# Patient Record
Sex: Male | Born: 1955 | Race: White | Hispanic: No | Marital: Married | Smoking: Former smoker
Health system: Southern US, Community
[De-identification: ages and names within clinical notes are randomized; demographics above are authoritative.]

## PROBLEM LIST (undated history)

## (undated) DIAGNOSIS — R569 Unspecified convulsions: Secondary | ICD-10-CM

## (undated) DIAGNOSIS — K219 Gastro-esophageal reflux disease without esophagitis: Secondary | ICD-10-CM

## (undated) DIAGNOSIS — K635 Polyp of colon: Secondary | ICD-10-CM

## (undated) DIAGNOSIS — Z720 Tobacco use: Secondary | ICD-10-CM

## (undated) DIAGNOSIS — I10 Essential (primary) hypertension: Secondary | ICD-10-CM

## (undated) DIAGNOSIS — F411 Generalized anxiety disorder: Secondary | ICD-10-CM

## (undated) HISTORY — DX: Generalized anxiety disorder: F41.1

## (undated) HISTORY — DX: Tobacco use: Z72.0

## (undated) HISTORY — DX: Essential (primary) hypertension: I10

## (undated) HISTORY — DX: Polyp of colon: K63.5

## (undated) HISTORY — DX: Gastro-esophageal reflux disease without esophagitis: K21.9

## (undated) HISTORY — DX: Unspecified convulsions: R56.9

---

## 1998-11-27 ENCOUNTER — Ambulatory Visit (HOSPITAL_COMMUNITY): Admission: RE | Admit: 1998-11-27 | Discharge: 1998-11-27 | Payer: Self-pay | Admitting: Gastroenterology

## 1999-04-04 ENCOUNTER — Other Ambulatory Visit: Admission: RE | Admit: 1999-04-04 | Discharge: 1999-04-04 | Payer: Self-pay | Admitting: Urology

## 2006-01-12 ENCOUNTER — Ambulatory Visit: Payer: Self-pay | Admitting: Family Medicine

## 2006-05-19 HISTORY — PX: COLONOSCOPY W/ POLYPECTOMY: SHX1380

## 2007-12-21 ENCOUNTER — Ambulatory Visit: Payer: Self-pay | Admitting: Family Medicine

## 2008-09-06 ENCOUNTER — Ambulatory Visit: Payer: Self-pay | Admitting: Family Medicine

## 2009-05-31 ENCOUNTER — Ambulatory Visit: Payer: Self-pay | Admitting: Family Medicine

## 2011-01-30 ENCOUNTER — Ambulatory Visit (INDEPENDENT_AMBULATORY_CARE_PROVIDER_SITE_OTHER): Payer: 59 | Admitting: Family Medicine

## 2011-01-30 ENCOUNTER — Encounter: Payer: Self-pay | Admitting: Family Medicine

## 2011-01-30 VITALS — BP 130/98 | HR 72 | Ht 76.0 in | Wt 231.0 lb

## 2011-01-30 DIAGNOSIS — N529 Male erectile dysfunction, unspecified: Secondary | ICD-10-CM

## 2011-01-30 DIAGNOSIS — I10 Essential (primary) hypertension: Secondary | ICD-10-CM

## 2011-01-30 DIAGNOSIS — Z23 Encounter for immunization: Secondary | ICD-10-CM

## 2011-01-30 DIAGNOSIS — Z79899 Other long term (current) drug therapy: Secondary | ICD-10-CM

## 2011-01-30 LAB — CBC WITH DIFFERENTIAL/PLATELET
Eosinophils Relative: 2 % (ref 0–5)
HCT: 43.5 % (ref 39.0–52.0)
Lymphocytes Relative: 42 % (ref 12–46)
Lymphs Abs: 1.8 10*3/uL (ref 0.7–4.0)
MCH: 33.1 pg (ref 26.0–34.0)
MCV: 94.2 fL (ref 78.0–100.0)
Monocytes Absolute: 0.4 10*3/uL (ref 0.1–1.0)
Monocytes Relative: 9 % (ref 3–12)
RBC: 4.62 MIL/uL (ref 4.22–5.81)
WBC: 4.3 10*3/uL (ref 4.0–10.5)

## 2011-01-30 LAB — COMPREHENSIVE METABOLIC PANEL
ALT: 26 U/L (ref 0–53)
BUN: 16 mg/dL (ref 6–23)
CO2: 27 mEq/L (ref 19–32)
Calcium: 9.4 mg/dL (ref 8.4–10.5)
Chloride: 104 mEq/L (ref 96–112)
Creat: 0.84 mg/dL (ref 0.50–1.35)
Glucose, Bld: 96 mg/dL (ref 70–99)

## 2011-01-30 LAB — LIPID PANEL
Cholesterol: 185 mg/dL (ref 0–200)
HDL: 76 mg/dL (ref >39)
Total CHOL/HDL Ratio: 2.4 Ratio
Triglycerides: 43 mg/dL (ref <150)

## 2011-01-30 MED ORDER — LISINOPRIL-HYDROCHLOROTHIAZIDE 10-12.5 MG PO TABS: 1.0000 | ORAL_TABLET | Freq: Every day | ORAL | Status: DC

## 2011-01-30 MED ORDER — VARDENAFIL HCL 20 MG PO TABS: 20.0000 mg | ORAL_TABLET | Freq: Every day | ORAL | Status: DC | PRN

## 2011-01-30 NOTE — Progress Notes (Signed)
  Subjective:    Patient ID: Nathaniel Pena, male    DOB: [birth year], 55 y.o.   MRN: [id number]  HPI He is here for a blood pressure recheck. He did stop taking his medications stating he didn't think he needed them anymore. He recently had blood pressure checked and it was elevated. He would also like a refill on his Levitra. This medication seems to be working well when he has the opportunity. He has not had blood work done in the last year.   Review of Systems     Objective:   Physical Exam Alert and in no distress. Blood pressure is recorded.       Assessment & Plan:   1. Hypertension    2. Encounter for long-term (current) use of other medications  CBC w/Diff, Comprehensive metabolic panel, Lipid panel  3. ED (erectile dysfunction)     I will renew his blood pressure medication as well as Levitra. Routine blood screening. Encouraged him to stay on the medication and explained the reason behind treating his hypertension. Recheck here in one month.

## 2011-02-03 ENCOUNTER — Telehealth: Payer: Self-pay

## 2011-02-03 NOTE — Telephone Encounter (Signed)
Called pt to inform labs look good

## 2011-02-12 ENCOUNTER — Other Ambulatory Visit: Payer: Self-pay | Admitting: Family Medicine

## 2011-03-03 ENCOUNTER — Encounter: Payer: Self-pay | Admitting: Family Medicine

## 2011-03-04 ENCOUNTER — Encounter: Payer: Self-pay | Admitting: Family Medicine

## 2011-03-04 ENCOUNTER — Ambulatory Visit (INDEPENDENT_AMBULATORY_CARE_PROVIDER_SITE_OTHER): Payer: 59 | Admitting: Family Medicine

## 2011-03-04 VITALS — BP 150/120 | HR 76 | Wt 217.0 lb

## 2011-03-04 DIAGNOSIS — I10 Essential (primary) hypertension: Secondary | ICD-10-CM

## 2011-03-04 NOTE — Progress Notes (Signed)
  Subjective:    Patient ID: Nathaniel Pena, male    DOB: 07/27/1955, 55 y.o.   MRN: [id number]  HPI He is here for a recheck on his blood pressure. He did miss taking his meds the last 2 days.   Review of Systems     Objective:   Physical Exam Alert and in no distress. 150/110       Assessment & Plan:  Hypertension. I will order a renal artery ultrasound

## 2011-03-04 NOTE — Patient Instructions (Signed)
I will call you when I get the results back

## 2012-02-06 ENCOUNTER — Other Ambulatory Visit: Payer: Self-pay | Admitting: Family Medicine

## 2012-02-10 ENCOUNTER — Telehealth: Payer: Self-pay | Admitting: Family Medicine

## 2012-02-10 MED ORDER — LISINOPRIL-HYDROCHLOROTHIAZIDE 10-12.5 MG PO TABS: 1.0000 | ORAL_TABLET | Freq: Every day | ORAL | Status: DC

## 2012-02-10 NOTE — Telephone Encounter (Signed)
I SENT RX FOR 30 DAYS TO HIS PHARMACY. CLS

## 2012-03-01 ENCOUNTER — Telehealth: Payer: Self-pay | Admitting: Family Medicine

## 2012-03-01 MED ORDER — LISINOPRIL-HYDROCHLOROTHIAZIDE 10-12.5 MG PO TABS: 1.0000 | ORAL_TABLET | Freq: Every day | ORAL | Status: DC

## 2012-03-01 NOTE — Telephone Encounter (Signed)
Pt needs refill on Lisinopril has cpe 03/26/12.  Refilled 1 month [facility] [address]

## 2012-03-09 ENCOUNTER — Encounter: Payer: 59 | Admitting: Family Medicine

## 2012-03-22 ENCOUNTER — Encounter: Payer: Self-pay | Admitting: Internal Medicine

## 2012-03-24 ENCOUNTER — Encounter: Payer: 59 | Admitting: Family Medicine

## 2012-03-26 ENCOUNTER — Encounter: Payer: Self-pay | Admitting: Family Medicine

## 2012-03-26 ENCOUNTER — Ambulatory Visit (INDEPENDENT_AMBULATORY_CARE_PROVIDER_SITE_OTHER): Payer: 59 | Admitting: Family Medicine

## 2012-03-26 VITALS — BP 150/110 | HR 68 | Ht 76.0 in | Wt 215.0 lb

## 2012-03-26 DIAGNOSIS — N529 Male erectile dysfunction, unspecified: Secondary | ICD-10-CM | POA: Insufficient documentation

## 2012-03-26 DIAGNOSIS — Z23 Encounter for immunization: Secondary | ICD-10-CM

## 2012-03-26 DIAGNOSIS — Z789 Other specified health status: Secondary | ICD-10-CM | POA: Insufficient documentation

## 2012-03-26 DIAGNOSIS — I10 Essential (primary) hypertension: Secondary | ICD-10-CM

## 2012-03-26 DIAGNOSIS — Z0000 Encounter for general adult medical examination without abnormal findings: Secondary | ICD-10-CM

## 2012-03-26 DIAGNOSIS — F1010 Alcohol abuse, uncomplicated: Secondary | ICD-10-CM

## 2012-03-26 LAB — CBC WITH DIFFERENTIAL/PLATELET
Basophils Absolute: 0 10*3/uL (ref 0.0–0.1)
Basophils Relative: 1 % (ref 0–1)
Eosinophils Relative: 1 % (ref 0–5)
HCT: 43.6 % (ref 39.0–52.0)
Hemoglobin: 15.6 g/dL (ref 13.0–17.0)
MCH: 33.8 pg (ref 26.0–34.0)
MCHC: 35.8 g/dL (ref 30.0–36.0)
MCV: 94.6 fL (ref 78.0–100.0)
Monocytes Absolute: 0.6 10*3/uL (ref 0.1–1.0)
Monocytes Relative: 10 % (ref 3–12)
RDW: 12.9 % (ref 11.5–15.5)

## 2012-03-26 LAB — POCT URINALYSIS DIPSTICK
Bilirubin, UA: NEGATIVE
Blood, UA: NEGATIVE
Glucose, UA: NEGATIVE
Nitrite, UA: NEGATIVE
Urobilinogen, UA: NEGATIVE

## 2012-03-26 MED ORDER — LISINOPRIL-HYDROCHLOROTHIAZIDE 20-12.5 MG PO TABS: 1.0000 | ORAL_TABLET | Freq: Every day | ORAL | Status: DC

## 2012-03-26 MED ORDER — VARDENAFIL HCL 20 MG PO TABS: 20.0000 mg | ORAL_TABLET | Freq: Every day | ORAL | Status: DC | PRN

## 2012-03-26 NOTE — Progress Notes (Signed)
  Subjective:    Patient ID: Nathaniel Pena, male    DOB: 1955-09-16, 56 y.o.   MRN: [id number]  HPI He is here for complete examination. There is a question of whether he is getting HCTZ with his lisinopril. He has been checking his pressures at home over the last several months and have been elevated. He intermittently uses Levitra to help with erectile dysfunction. He continues to drink roughly a sixpack per night. 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 His home life is stable. He has a colonoscopy set up in the near future.  Review of Systems  Constitutional: Negative.   HENT: Negative.   Eyes: Negative.   Respiratory: Negative.   Gastrointestinal: Negative.   Genitourinary: Negative.   Musculoskeletal: Negative.   Skin: Negative.   Neurological: Negative.   Hematological: Negative.   Psychiatric/Behavioral: Negative.        Objective:   Physical Exam BP 150/110  Pulse 68  Ht 6\' 4"  (1.93 m)  Wt 215 lb (97.523 kg)  BMI 26.17 kg/m2  General Appearance:    Alert, cooperative, no distress, appears stated age  Head:    Normocephalic, without obvious abnormality, atraumatic  Eyes:    PERRL, conjunctiva/corneas clear, EOM's intact, fundi    benign  Ears:    Normal TM's and external ear canals  Nose:   Nares normal, mucosa normal, no drainage or sinus   tenderness  Throat:   Lips, mucosa, and tongue normal; teeth and gums normal  Neck:   Supple, no lymphadenopathy;  thyroid:  no   enlargement/tenderness/nodules; no carotid   bruit or JVD  Back:    Spine nontender, no curvature, ROM normal, no CVA     tenderness  Lungs:     Clear to auscultation bilaterally without wheezes, rales or     ronchi; respirations unlabored  Chest Wall:    No tenderness or deformity   Heart:    Regular rate and rhythm, S1 and S2 normal, no murmur, rub   or gallop  Breast Exam:    No chest wall tenderness, masses or gynecomastia  Abdomen:     Soft, non-tender, nondistended, normoactive  bowel sounds,    no masses, no hepatosplenomegaly  Genitalia:   deferred at patient request   Rectal:   deferred   Extremities:   No clubbing, cyanosis or edema  Pulses:   2+ and symmetric all extremities  Skin:   Skin color, texture, turgor normal, no rashes or lesions  Lymph nodes:   Cervical, supraclavicular, and axillary nodes normal  Neurologic:   CNII-XII intact, normal strength, sensation and gait; reflexes 2+ and symmetric throughout          Psych:   Normal mood, affect, hygiene and grooming.           Assessment & Plan:   1. Routine general medical examination at a health care facility  Lipid panel, CBC with Differential, Comprehensive metabolic panel, POCT Urinalysis Dipstick  2. Hypertension  lisinopril-hydrochlorothiazide (ZESTORETIC) 20-12.5 MG per tablet, Lipid panel, CBC with Differential, Comprehensive metabolic panel, POCT Urinalysis Dipstick  3. ED (erectile dysfunction)  vardenafil (LEVITRA) 20 MG tablet  4. Heavy alcohol use  Comprehensive metabolic panel   discussed the need for him to cut back on alcohol consumption to one or 2 beverages per day however it is very unlikely that this will occur.

## 2012-03-27 LAB — LIPID PANEL
Cholesterol: 179 mg/dL (ref 0–200)
HDL: 91 mg/dL (ref >39)
Triglycerides: 31 mg/dL (ref <150)

## 2012-03-27 LAB — COMPREHENSIVE METABOLIC PANEL
AST: 32 U/L (ref 0–37)
Albumin: 4.5 g/dL (ref 3.5–5.2)
Alkaline Phosphatase: 49 U/L (ref 39–117)
BUN: 11 mg/dL (ref 6–23)
Calcium: 9.2 mg/dL (ref 8.4–10.5)
Creat: 0.75 mg/dL (ref 0.50–1.35)
Glucose, Bld: 64 mg/dL — ABNORMAL LOW (ref 70–99)
Potassium: 4.1 mEq/L (ref 3.5–5.3)

## 2012-03-28 NOTE — Progress Notes (Signed)
Quick Note:  The blood work is normal ______ 

## 2012-04-26 ENCOUNTER — Encounter: Payer: Self-pay | Admitting: Family Medicine

## 2012-04-26 ENCOUNTER — Ambulatory Visit (INDEPENDENT_AMBULATORY_CARE_PROVIDER_SITE_OTHER): Payer: 59 | Admitting: Family Medicine

## 2012-04-26 VITALS — BP 138/90 | HR 64 | Wt 211.0 lb

## 2012-04-26 DIAGNOSIS — I10 Essential (primary) hypertension: Secondary | ICD-10-CM

## 2012-04-26 DIAGNOSIS — F1010 Alcohol abuse, uncomplicated: Secondary | ICD-10-CM

## 2012-04-26 DIAGNOSIS — Z789 Other specified health status: Secondary | ICD-10-CM

## 2012-04-26 NOTE — Progress Notes (Signed)
  Subjective:    Patient ID: Nathaniel Pena, male    DOB: 10-05-1955, 56 y.o.   MRN: [id number]  HPI He is here for a followup visit on his last encounter we discussed his alcohol use and he states he has cut down to approximately 4 beers per day. Also his lisinopril was increased. He has been checking this at home and notes that they are in a good range.   Review of Systems     Objective:   Physical Exam Alert and in no distress. Blood pressure is recorded.       Assessment & Plan:   1. Hypertension   2. Heavy alcohol use    continue present medication regimen. Again recommended cutting back on alcohol consumption to one or 2 per day.

## 2012-06-01 LAB — HM COLONOSCOPY

## 2012-06-02 ENCOUNTER — Other Ambulatory Visit: Payer: Self-pay | Admitting: Family Medicine

## 2012-07-16 ENCOUNTER — Encounter: Payer: Self-pay | Admitting: Family Medicine

## 2012-09-09 ENCOUNTER — Other Ambulatory Visit: Payer: Self-pay | Admitting: Family Medicine

## 2013-04-07 ENCOUNTER — Institutional Professional Consult (permissible substitution): Payer: Self-pay | Admitting: Family Medicine

## 2013-04-08 ENCOUNTER — Encounter: Payer: Self-pay | Admitting: Medical

## 2013-04-08 ENCOUNTER — Ambulatory Visit (INDEPENDENT_AMBULATORY_CARE_PROVIDER_SITE_OTHER): Payer: 59 | Admitting: Medical

## 2013-04-08 VITALS — BP 130/90 | HR 55 | Temp 97.6°F | Resp 16 | Wt 208.0 lb

## 2013-04-08 DIAGNOSIS — F1010 Alcohol abuse, uncomplicated: Secondary | ICD-10-CM

## 2013-04-08 DIAGNOSIS — F341 Dysthymic disorder: Secondary | ICD-10-CM

## 2013-04-08 DIAGNOSIS — F418 Other specified anxiety disorders: Secondary | ICD-10-CM

## 2013-04-08 MED ORDER — ALPRAZOLAM 0.5 MG PO TABS: ORAL_TABLET | ORAL | Status: DC

## 2013-04-08 MED ORDER — BUPROPION HCL ER (XL) 150 MG PO TB24: 150.0000 mg | ORAL_TABLET | Freq: Every day | ORAL | Status: DC

## 2013-04-08 NOTE — Progress Notes (Signed)
Subjective: Here today to discuss concerns.  He reports a long history of alcohol consumption, typically on the order of a sixpack per night. His primary care provider here Dr. Susann Givens has urged him to stop alcohol several times in the past. Lately he has been giving this more thought.  His wife has been on him to quit as well, and he has been waking up some thinking about his alcohol consumption. He decided to stop cold [place] this past Sunday 5 days ago. He is here today because after taking a few of his wife's Xanax, this completely took away his urge to drink and he helped with his anxiety.  He has been prescribed Xanax 5 years ago for anxiety. This helps calm his nerves. 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 His anxiety has worsened by watching news stories that are typically negative. Overall his home life is fine, has a good relationship with his wife has a 68 year old and 40 year old children at home. His work is fine, works at a golf course.  He has never seen psychiatry, counselor, therapist. He has never been treated for alcoholism or withdrawals.  Was briefly on Prozac a few years ago prescribed by [place] here, but this made him almost carefree about things.  He reports that his mother also dealt with anxiety and alcoholism.  Despite drinking a 6 pack nightly he considers himself functional. Has built up a tolerance.  Denies homicidal or suicidal ideation.  Objective: Filed Vitals:   04/08/13 0910  BP: 130/90  Pulse: 55  Temp: 97.6 F (36.4 C)  Resp: 16    General appearance: alert, no distress, WD/WN Psych: pleasant, good eye contact, answers questions appropriately  Assessment: Encounter Diagnoses  Name Primary?  . Depression with anxiety Yes  . Alcohol abuse     Plan: We discussed his concerns, his alcohol use, his past.  Discussed the possibility of alcohol withdrawal. Discussed  depression.  Discussed triggers for his anxiety.   I recommend he consider counseling which we discussed.  Discussed attending an AA meeting that he probably would not do.  Advise he consider calling the help line through his employer for counseling. Return or go to the emergency department if experiencing withdrawal symptoms. Advise he cut out watching news on TV, instead get his news by reading the paper or reading it online so he doesn't get all the auditory and visual stimuli.  He is agreeable to beginning Wellbutrin, gave short-term Xanax both for anxiety and to help with preventing withdrawals.   He understands and is agreeable to this plan. Follow-up 1-2 weeks with Dr. Susann Givens

## 2013-04-12 ENCOUNTER — Ambulatory Visit (INDEPENDENT_AMBULATORY_CARE_PROVIDER_SITE_OTHER): Payer: 59 | Admitting: Family Medicine

## 2013-04-12 ENCOUNTER — Encounter: Payer: Self-pay | Admitting: Family Medicine

## 2013-04-12 DIAGNOSIS — F411 Generalized anxiety disorder: Secondary | ICD-10-CM

## 2013-04-12 DIAGNOSIS — F1010 Alcohol abuse, uncomplicated: Secondary | ICD-10-CM

## 2013-04-12 DIAGNOSIS — F419 Anxiety disorder, unspecified: Secondary | ICD-10-CM

## 2013-04-12 NOTE — Progress Notes (Signed)
  Subjective:    Patient ID: Nathaniel Pena, male    DOB: 02-Aug-1955, 57 y.o.   MRN: [id number]  HPI He is here for consult concerning his medication and alcohol. He has subsequently stopped drinking at the suggestion of his wife. He notes that since he stopped drinking his sleeping habits have improved. He also recognizes that he was having more anxiety than he realized. He seems to worry about the future especially with his children. He did take Xanax and notes that the Xanax actually helps better than the alcohol in regard to his anxiety and he would like more of this.   Review of Systems     Objective:   Physical Exam Alert and in no distress with appropriate affect and dressed appropriately.       Assessment & Plan:  ETOH abuse  Anxiety  over 30 minutes spent discussing his anxiety and the use of alcohol. I discussed anxiety in regard to worrying about the future. Recommended that he use the serenity prayer to help with this. Recommend he use Xanax sparingly and only as needed for when he is about to slip over the edge. He did state that he was willing to start the Wellbutrin to see if this would help with his underlying anxiety. Discussed counseling with him however at this point is not willing. Recheck here in about 2 weeks.

## 2013-04-12 NOTE — Patient Instructions (Signed)
When you become anxious I want you to use the serenity prayer. I will give you Xanax for when your bouts slipped over the edge but I will not keep you on it. Xanax is to be used when you're about to go over the edge not when you think you might

## 2013-06-17 ENCOUNTER — Telehealth: Payer: Self-pay | Admitting: Family Medicine

## 2013-06-17 MED ORDER — LISINOPRIL-HYDROCHLOROTHIAZIDE 20-12.5 MG PO TABS: ORAL_TABLET | ORAL | Status: DC

## 2013-06-17 NOTE — Telephone Encounter (Signed)
Lisinopril calld in.

## 2013-09-05 ENCOUNTER — Ambulatory Visit (INDEPENDENT_AMBULATORY_CARE_PROVIDER_SITE_OTHER): Payer: 59 | Admitting: Family Medicine

## 2013-09-05 ENCOUNTER — Encounter: Payer: Self-pay | Admitting: Family Medicine

## 2013-09-05 VITALS — BP 150/100 | HR 72 | Wt 210.0 lb

## 2013-09-05 DIAGNOSIS — N529 Male erectile dysfunction, unspecified: Secondary | ICD-10-CM

## 2013-09-05 DIAGNOSIS — Z79899 Other long term (current) drug therapy: Secondary | ICD-10-CM

## 2013-09-05 DIAGNOSIS — F1090 Alcohol use, unspecified, uncomplicated: Secondary | ICD-10-CM

## 2013-09-05 DIAGNOSIS — Z789 Other specified health status: Secondary | ICD-10-CM

## 2013-09-05 DIAGNOSIS — F341 Dysthymic disorder: Secondary | ICD-10-CM

## 2013-09-05 DIAGNOSIS — I10 Essential (primary) hypertension: Secondary | ICD-10-CM

## 2013-09-05 LAB — CBC WITH DIFFERENTIAL/PLATELET
BASOS PCT: 0 % (ref 0–1)
Basophils Absolute: 0 10*3/uL (ref 0.0–0.1)
EOS PCT: 1 % (ref 0–5)
Eosinophils Absolute: 0 10*3/uL (ref 0.0–0.7)
HCT: 40.9 % (ref 39.0–52.0)
HEMOGLOBIN: 13.8 g/dL (ref 13.0–17.0)
LYMPHS ABS: 1.3 10*3/uL (ref 0.7–4.0)
Lymphocytes Relative: 40 % (ref 12–46)
MCH: 30.9 pg (ref 26.0–34.0)
MCHC: 33.7 g/dL (ref 30.0–36.0)
MCV: 91.5 fL (ref 78.0–100.0)
MONOS PCT: 9 % (ref 3–12)
Monocytes Absolute: 0.3 10*3/uL (ref 0.1–1.0)
Neutro Abs: 1.6 10*3/uL — ABNORMAL LOW (ref 1.7–7.7)
Neutrophils Relative %: 50 % (ref 43–77)
Platelets: 166 10*3/uL (ref 150–400)
RBC: 4.47 MIL/uL (ref 4.22–5.81)
RDW: 14.2 % (ref 11.5–15.5)
WBC: 3.2 10*3/uL — ABNORMAL LOW (ref 4.0–10.5)

## 2013-09-05 MED ORDER — LISINOPRIL-HYDROCHLOROTHIAZIDE 20-12.5 MG PO TABS: ORAL_TABLET | ORAL | Status: DC

## 2013-09-05 MED ORDER — VARDENAFIL HCL 20 MG PO TABS: 20.0000 mg | ORAL_TABLET | Freq: Every day | ORAL | Status: DC | PRN

## 2013-09-05 MED ORDER — BUPROPION HCL ER (XL) 150 MG PO TB24: 150.0000 mg | ORAL_TABLET | Freq: Every day | ORAL | Status: DC

## 2013-09-05 NOTE — Progress Notes (Signed)
   Subjective:    Patient ID: Nathaniel Pena, male    DOB: Oct 05, 1955, 58 y.o.   MRN: [id number]  HPI  Mr. Liem Copenhaver is a 58 yo man with PMH significant for HTN and ED who presents today for medication follow up. The patient recently ran out of his hypertension medication and believes that is why his BP was elevated today, he last took his medication 3 days ago. The patient normally checks his BP at home daily and the readings are generally around 120/80. The patient also needs refills of his levitra today. The patient lost his previous Wellbutrin prescription and so never filled it. He would like that medication represcribed today. He states that he continues to worry excessively. He states that he has cut back slightly on his alcohol consumption. His work and home life are stable. He would like his medications renewed. Review of Systems is negative except per HPI.     Objective:   Physical Exam  Constitutional: Patient is oriented to person, place, and time and well-developed, well-nourished, and in no distress. Cardiovascular: Normal rate, regular rhythm. Exam reveals no murmurs, gallops and no friction rub.  Pulmonary/Chest: Effort normal and breath sounds normal. No respiratory distress. No wheezes or ronchi.      Assessment & Plan:  ED (erectile dysfunction) - Plan: vardenafil (LEVITRA) 20 MG tablet  Heavy alcohol use  Hypertension - Plan: lisinopril-hydrochlorothiazide (PRINZIDE,ZESTORETIC) 20-12.5 MG per tablet, CBC with Differential, Comprehensive metabolic panel  Dysthymia - Plan: buPROPion (WELLBUTRIN XL) 150 MG 24 hr tablet  Encounter for long-term (current) use of other medications - Plan: CBC with Differential, Comprehensive metabolic panel, Lipid panel  Will refill his prescriptions today. Did get him to commit to having only 2 beers per night. He will followup with me in one month.

## 2013-09-05 NOTE — Progress Notes (Deleted)
   Subjective:    Patient ID: EVERETTE MALL, male    DOB: [birth year], 58 y.o.   MRN: [id number]  HPI  Mr. Porter Moes is a 58 yo man with PMH significant for HTN and ED who presents today for medication follow up. The patient recently ran out of his hypertension medication and believes that is why his BP was elevated today, he last took his medication 3 days ago. The patient normally checks his BP at home daily and the readings are generally around 120/80. The patient also needs refills of his levitra today. The patient lost his previous Wellbutrin prescription and so never filled it. He would like that medication represcribed today.   Review of Systems is negative except per HPI.     Objective:   Physical Exam  Constitutional: Patient is oriented to person, place, and time and well-developed, well-nourished, and in no distress. Cardiovascular: Normal rate, regular rhythm. Exam reveals no murmurs, gallops and no friction rub.  Pulmonary/Chest: Effort normal and breath sounds normal. No respiratory distress. No wheezes or ronchi.      Assessment & Plan:  ED (erectile dysfunction)  Heavy alcohol use  Hypertension  Will refill his prescriptions today.

## 2013-09-06 LAB — COMPREHENSIVE METABOLIC PANEL
ALK PHOS: 48 U/L (ref 39–117)
ALT: 36 U/L (ref 0–53)
AST: 34 U/L (ref 0–37)
Albumin: 3.9 g/dL (ref 3.5–5.2)
BILIRUBIN TOTAL: 0.3 mg/dL (ref 0.2–1.2)
BUN: 8 mg/dL (ref 6–23)
CO2: 26 meq/L (ref 19–32)
CREATININE: 0.69 mg/dL (ref 0.50–1.35)
Calcium: 8.4 mg/dL (ref 8.4–10.5)
Chloride: 110 mEq/L (ref 96–112)
Glucose, Bld: 91 mg/dL (ref 70–99)
Potassium: 4.4 mEq/L (ref 3.5–5.3)
Sodium: 144 mEq/L (ref 135–145)
Total Protein: 5.9 g/dL — ABNORMAL LOW (ref 6.0–8.3)

## 2013-09-06 LAB — LIPID PANEL
CHOLESTEROL: 168 mg/dL (ref 0–200)
HDL: 79 mg/dL (ref >39)
LDL Cholesterol: 82 mg/dL (ref 0–99)
TRIGLYCERIDES: 35 mg/dL (ref <150)
Total CHOL/HDL Ratio: 2.1 Ratio
VLDL: 7 mg/dL (ref 0–40)

## 2014-01-30 ENCOUNTER — Ambulatory Visit (INDEPENDENT_AMBULATORY_CARE_PROVIDER_SITE_OTHER): Payer: 59 | Admitting: Family Medicine

## 2014-01-30 ENCOUNTER — Encounter: Payer: Self-pay | Admitting: Family Medicine

## 2014-01-30 ENCOUNTER — Telehealth: Payer: Self-pay

## 2014-01-30 ENCOUNTER — Ambulatory Visit
Admission: RE | Admit: 2014-01-30 | Discharge: 2014-01-30 | Disposition: A | Discharge status: Home / Self Care | Payer: 59 | Source: Ambulatory Visit | Attending: Family Medicine | Admitting: Family Medicine

## 2014-01-30 VITALS — BP 150/100 | HR 100 | Wt 202.0 lb

## 2014-01-30 DIAGNOSIS — S0180XA Unspecified open wound of other part of head, initial encounter: Secondary | ICD-10-CM

## 2014-01-30 DIAGNOSIS — M542 Cervicalgia: Secondary | ICD-10-CM

## 2014-01-30 DIAGNOSIS — S0181XA Laceration without foreign body of other part of head, initial encounter: Secondary | ICD-10-CM

## 2014-01-30 NOTE — Progress Notes (Signed)
   Subjective:    Patient ID: Nathaniel Pena, male    DOB: 1955/07/25, 58 y.o.   MRN: [id number]  HPI He is here for evaluation of a head injury that occurred last night. This apparently occurred at home when he tripped over his dog fell and injured the right side of the forehead and cheek. He also has a lesion on the mid parietal area and does complain of neck pain. This morning he did note some tingling sensation in the right fifth finger however this has gone away. He has a previous history of neck trauma. Apparently a small mild alcohol wasn't involved.  Review of Systems     Objective:   Physical Exam Alert and in no distress. Sutures are noted in the forehead lesion with some slight serosanguineous drainage. No evidence of infection. Abrasion noted in the mid parietal area. EOMI. Ecchymosis noted over the right zygomatic arch with tenderness to palpation. Neck shows slight tenderness over the lower cervical area with normal sensation in his arms and hands.       Assessment & Plan:  Forehead laceration, initial encounter  Neck pain, acute - Plan: DG Cervical Spine Complete  recommend conservative care for the forehead laceration and return here in roughly 5 days for suture removal.

## 2014-01-30 NOTE — Telephone Encounter (Signed)
CALLED PT TO INFORM HIM NO ACUTE ABNORMALITY'S NOTE  PER JCL PATIENT VERBALIZED UNDERSTANDING

## 2014-09-11 ENCOUNTER — Other Ambulatory Visit: Payer: Self-pay | Admitting: Family Medicine

## 2014-09-14 ENCOUNTER — Other Ambulatory Visit: Payer: Self-pay | Admitting: Family Medicine

## 2014-09-14 NOTE — Telephone Encounter (Signed)
Is this okay to refill? 

## 2014-12-16 ENCOUNTER — Other Ambulatory Visit: Payer: Self-pay | Admitting: Family Medicine

## 2015-03-25 ENCOUNTER — Other Ambulatory Visit: Payer: Self-pay | Admitting: Family Medicine

## 2015-06-25 ENCOUNTER — Other Ambulatory Visit: Payer: Self-pay | Admitting: Family Medicine

## 2015-11-13 ENCOUNTER — Telehealth: Payer: Self-pay

## 2015-11-13 MED ORDER — LISINOPRIL-HYDROCHLOROTHIAZIDE 20-12.5 MG PO TABS: 1.0000 | ORAL_TABLET | Freq: Every day | ORAL | Status: DC

## 2015-11-13 NOTE — Telephone Encounter (Signed)
Pt called the office to schedule f/u with Dr. Redmond School for 11/21/2015. He states he is out of his Lisinopril/HCTZ. Advised pt that I can renew for 30 days to last until appt, but he must keep appt to get further refills. He verbalized understanding of this. Victorino December

## 2015-11-21 ENCOUNTER — Encounter: Payer: Self-pay | Admitting: Family Medicine

## 2015-11-21 ENCOUNTER — Ambulatory Visit (INDEPENDENT_AMBULATORY_CARE_PROVIDER_SITE_OTHER): Payer: Commercial Managed Care - HMO | Admitting: Family Medicine

## 2015-11-21 ENCOUNTER — Other Ambulatory Visit: Payer: Self-pay | Admitting: Family Medicine

## 2015-11-21 VITALS — BP 130/90 | HR 86 | Ht 76.0 in | Wt 190.6 lb

## 2015-11-21 DIAGNOSIS — Z79899 Other long term (current) drug therapy: Secondary | ICD-10-CM | POA: Diagnosis not present

## 2015-11-21 DIAGNOSIS — F1090 Alcohol use, unspecified, uncomplicated: Secondary | ICD-10-CM

## 2015-11-21 DIAGNOSIS — I493 Ventricular premature depolarization: Secondary | ICD-10-CM | POA: Diagnosis not present

## 2015-11-21 DIAGNOSIS — Z789 Other specified health status: Secondary | ICD-10-CM | POA: Diagnosis not present

## 2015-11-21 DIAGNOSIS — I10 Essential (primary) hypertension: Secondary | ICD-10-CM | POA: Diagnosis not present

## 2015-11-21 DIAGNOSIS — F329 Major depressive disorder, single episode, unspecified: Secondary | ICD-10-CM

## 2015-11-21 DIAGNOSIS — Z6379 Other stressful life events affecting family and household: Secondary | ICD-10-CM | POA: Diagnosis not present

## 2015-11-21 DIAGNOSIS — N529 Male erectile dysfunction, unspecified: Secondary | ICD-10-CM | POA: Diagnosis not present

## 2015-11-21 DIAGNOSIS — F32A Depression, unspecified: Secondary | ICD-10-CM

## 2015-11-21 LAB — CBC WITH DIFFERENTIAL/PLATELET
BASOS ABS: 46 {cells}/uL (ref 0–200)
Basophils Relative: 1 %
Eosinophils Absolute: 0 cells/uL — ABNORMAL LOW (ref 15–500)
Eosinophils Relative: 0 %
HEMATOCRIT: 42.5 % (ref 38.5–50.0)
Hemoglobin: 14.7 g/dL (ref 13.2–17.1)
LYMPHS ABS: 1104 {cells}/uL (ref 850–3900)
LYMPHS PCT: 24 %
MCH: 33.4 pg — AB (ref 27.0–33.0)
MCHC: 34.6 g/dL (ref 32.0–36.0)
MCV: 96.6 fL (ref 80.0–100.0)
MONO ABS: 552 {cells}/uL (ref 200–950)
MPV: 8.4 fL (ref 7.5–12.5)
Monocytes Relative: 12 %
NEUTROS PCT: 63 %
Neutro Abs: 2898 cells/uL (ref 1500–7800)
Platelets: 232 10*3/uL (ref 140–400)
RBC: 4.4 MIL/uL (ref 4.20–5.80)
RDW: 13.6 % (ref 11.0–15.0)
WBC: 4.6 10*3/uL (ref 4.0–10.5)

## 2015-11-21 MED ORDER — VARDENAFIL HCL 20 MG PO TABS: ORAL_TABLET | ORAL | Status: DC

## 2015-11-21 MED ORDER — LISINOPRIL-HYDROCHLOROTHIAZIDE 20-12.5 MG PO TABS: 1.0000 | ORAL_TABLET | Freq: Every day | ORAL | Status: DC

## 2015-11-21 NOTE — Progress Notes (Signed)
   Subjective:    Patient ID: Nathaniel Pena, male    DOB: 1955/10/28, 60 y.o.   MRN: [id number]  HPI He is here for medication management visit. He continues on his lisinopril and having no difficulty with this. He has stopped taking the Wellbutrin stating he didn't think it helps but admits freely that he is still depressed. He continues to drink 8-10 beers per day and readily admits that he is doing this to self medicate distress and he is under. He has been under more recent stress dealing with his 64 year old son having some problems with the law. He also admits that he and his wife have been the neighbors concerning his son and that they are essentially covering all of his expenses even though he is now working and not in school. He would also like a refill on his Levitra. His work is going well. His marriage is strong. Family and social history as well as health maintenance was reviewed.   Review of Systems     Objective:   Physical Exam Alert and in no distress. Tympanic membranes and canals are normal. Pharyngeal area is normal. Neck is supple without adenopathy or thyromegaly. Cardiac exam shows an  irregular sinus rhythm without murmurs or gallops. Lungs are clear to auscultation. EKG does show unifocal PVC       Assessment & Plan:  Essential hypertension - Plan: EKG 12-Lead, CBC with Differential/Platelet, Comprehensive metabolic panel, Lipid panel  Heavy alcohol use - Plan: CBC with Differential/Platelet, Comprehensive metabolic panel  Erectile dysfunction, unspecified erectile dysfunction type - Plan: CBC with Differential/Platelet, Comprehensive metabolic panel  Encounter for long-term (current) use of medications - Plan: CBC with Differential/Platelet, Comprehensive metabolic panel, Lipid panel  Depression  Other stressful life events affecting family and household He is aware that he is using alcohol to hide his stress and that he is enabling his son. He plans to talk  to his son's counselor to get help.I strongly encouraged him to get help with dealing with stop enabling his son but also to help with his underlying depression. I explained that I could not in good conscience give him an antidepressant if he continues to drink stating the 2 would work against each other. I will work with him in the future. Explained that he had a PVC but at this time no further intervention was needed. Plain that the first thing that needs to be done is for him to quit drinking and if he needs help, counseling or referral to an alcohol treatment center would be in order. Over 45 minutes, greater than 50% spent in counseling and coordination of care.

## 2015-11-22 LAB — LIPID PANEL
Cholesterol: 203 mg/dL — ABNORMAL HIGH (ref 125–200)
HDL: 135 mg/dL (ref >=40)
LDL CALC: 59 mg/dL (ref <130)
TRIGLYCERIDES: 46 mg/dL (ref <150)
Total CHOL/HDL Ratio: 1.5 Ratio (ref <=5.0)
VLDL: 9 mg/dL (ref <30)

## 2015-11-22 LAB — COMPREHENSIVE METABOLIC PANEL
ALT: 50 U/L — ABNORMAL HIGH (ref 9–46)
AST: 54 U/L — ABNORMAL HIGH (ref 10–35)
Albumin: 4.4 g/dL (ref 3.6–5.1)
Alkaline Phosphatase: 49 U/L (ref 40–115)
BILIRUBIN TOTAL: 0.7 mg/dL (ref 0.2–1.2)
BUN: 12 mg/dL (ref 7–25)
CALCIUM: 9 mg/dL (ref 8.6–10.3)
CO2: 26 mmol/L (ref 20–31)
Chloride: 98 mmol/L (ref 98–110)
Creat: 0.71 mg/dL (ref 0.70–1.25)
Glucose, Bld: 92 mg/dL (ref 65–99)
Potassium: 4.1 mmol/L (ref 3.5–5.3)
Sodium: 135 mmol/L (ref 135–146)
Total Protein: 6.4 g/dL (ref 6.1–8.1)

## 2015-11-23 LAB — HEPATITIS C ANTIBODY: HCV Ab: NEGATIVE

## 2016-05-15 ENCOUNTER — Telehealth: Payer: Self-pay | Admitting: Family Medicine

## 2016-05-15 MED ORDER — TADALAFIL 20 MG PO TABS: 20.0000 mg | ORAL_TABLET | Freq: Every day | ORAL | 5 refills | Status: DC | PRN

## 2016-05-15 NOTE — Telephone Encounter (Signed)
ALERT PT HAS NEW PHARMACY. Pt recently had a change in pharmacy. He now uses [facility]. They advised him that if he changes his ED med to either Viagra or Cialis he could have a significant savings. Please change and send to new pharmacy. Pt can be reached at [phone].

## 2016-05-15 NOTE — Telephone Encounter (Signed)
I called it in 

## 2016-05-26 ENCOUNTER — Inpatient Hospital Stay (HOSPITAL_COMMUNITY): Payer: 59

## 2016-05-26 ENCOUNTER — Emergency Department (HOSPITAL_COMMUNITY): Payer: 59

## 2016-05-26 ENCOUNTER — Inpatient Hospital Stay (HOSPITAL_COMMUNITY)
Admission: EM | Admit: 2016-05-26 | Discharge: 2016-05-28 | DRG: 392 | Disposition: A | Discharge status: Home / Self Care | Payer: 59 | Attending: Internal Medicine | Admitting: Internal Medicine

## 2016-05-26 ENCOUNTER — Encounter (HOSPITAL_COMMUNITY): Payer: Self-pay

## 2016-05-26 DIAGNOSIS — Z789 Other specified health status: Secondary | ICD-10-CM

## 2016-05-26 DIAGNOSIS — R740 Nonspecific elevation of levels of transaminase and lactic acid dehydrogenase [LDH]: Secondary | ICD-10-CM | POA: Diagnosis present

## 2016-05-26 DIAGNOSIS — F17220 Nicotine dependence, chewing tobacco, uncomplicated: Secondary | ICD-10-CM | POA: Diagnosis present

## 2016-05-26 DIAGNOSIS — I672 Cerebral atherosclerosis: Secondary | ICD-10-CM | POA: Diagnosis not present

## 2016-05-26 DIAGNOSIS — R197 Diarrhea, unspecified: Secondary | ICD-10-CM | POA: Diagnosis not present

## 2016-05-26 DIAGNOSIS — E8729 Other acidosis: Secondary | ICD-10-CM | POA: Diagnosis present

## 2016-05-26 DIAGNOSIS — I959 Hypotension, unspecified: Secondary | ICD-10-CM | POA: Diagnosis present

## 2016-05-26 DIAGNOSIS — K2920 Alcoholic gastritis without bleeding: Principal | ICD-10-CM | POA: Diagnosis present

## 2016-05-26 DIAGNOSIS — F411 Generalized anxiety disorder: Secondary | ICD-10-CM

## 2016-05-26 DIAGNOSIS — D72829 Elevated white blood cell count, unspecified: Secondary | ICD-10-CM | POA: Diagnosis present

## 2016-05-26 DIAGNOSIS — R112 Nausea with vomiting, unspecified: Secondary | ICD-10-CM | POA: Diagnosis not present

## 2016-05-26 DIAGNOSIS — K219 Gastro-esophageal reflux disease without esophagitis: Secondary | ICD-10-CM | POA: Diagnosis present

## 2016-05-26 DIAGNOSIS — R748 Abnormal levels of other serum enzymes: Secondary | ICD-10-CM | POA: Diagnosis not present

## 2016-05-26 DIAGNOSIS — F10230 Alcohol dependence with withdrawal, uncomplicated: Secondary | ICD-10-CM | POA: Diagnosis not present

## 2016-05-26 DIAGNOSIS — Z86010 Personal history of colonic polyps: Secondary | ICD-10-CM

## 2016-05-26 DIAGNOSIS — F10239 Alcohol dependence with withdrawal, unspecified: Secondary | ICD-10-CM | POA: Diagnosis present

## 2016-05-26 DIAGNOSIS — F322 Major depressive disorder, single episode, severe without psychotic features: Secondary | ICD-10-CM | POA: Diagnosis not present

## 2016-05-26 DIAGNOSIS — Z79899 Other long term (current) drug therapy: Secondary | ICD-10-CM

## 2016-05-26 DIAGNOSIS — F1090 Alcohol use, unspecified, uncomplicated: Secondary | ICD-10-CM

## 2016-05-26 DIAGNOSIS — E872 Acidosis: Secondary | ICD-10-CM | POA: Diagnosis not present

## 2016-05-26 DIAGNOSIS — D696 Thrombocytopenia, unspecified: Secondary | ICD-10-CM | POA: Diagnosis present

## 2016-05-26 DIAGNOSIS — I10 Essential (primary) hypertension: Secondary | ICD-10-CM | POA: Diagnosis present

## 2016-05-26 DIAGNOSIS — F329 Major depressive disorder, single episode, unspecified: Secondary | ICD-10-CM | POA: Diagnosis present

## 2016-05-26 DIAGNOSIS — I639 Cerebral infarction, unspecified: Secondary | ICD-10-CM

## 2016-05-26 DIAGNOSIS — Z87891 Personal history of nicotine dependence: Secondary | ICD-10-CM | POA: Diagnosis not present

## 2016-05-26 DIAGNOSIS — F32A Depression, unspecified: Secondary | ICD-10-CM | POA: Diagnosis present

## 2016-05-26 DIAGNOSIS — F1010 Alcohol abuse, uncomplicated: Secondary | ICD-10-CM

## 2016-05-26 DIAGNOSIS — R55 Syncope and collapse: Secondary | ICD-10-CM | POA: Diagnosis not present

## 2016-05-26 DIAGNOSIS — Y909 Presence of alcohol in blood, level not specified: Secondary | ICD-10-CM | POA: Diagnosis not present

## 2016-05-26 DIAGNOSIS — F10939 Alcohol use, unspecified with withdrawal, unspecified: Secondary | ICD-10-CM | POA: Diagnosis present

## 2016-05-26 LAB — CBC WITH DIFFERENTIAL/PLATELET
Basophils Absolute: 0 10*3/uL (ref 0.0–0.1)
Basophils Relative: 0 %
EOS PCT: 0 %
Eosinophils Absolute: 0 10*3/uL (ref 0.0–0.7)
HCT: 43.8 % (ref 39.0–52.0)
Hemoglobin: 15.2 g/dL (ref 13.0–17.0)
LYMPHS PCT: 5 %
Lymphs Abs: 0.9 10*3/uL (ref 0.7–4.0)
MCH: 34 pg (ref 26.0–34.0)
MCHC: 34.7 g/dL (ref 30.0–36.0)
MCV: 98 fL (ref 78.0–100.0)
MONO ABS: 0.7 10*3/uL (ref 0.1–1.0)
Monocytes Relative: 4 %
Neutro Abs: 15.1 10*3/uL — ABNORMAL HIGH (ref 1.7–7.7)
Neutrophils Relative %: 91 %
Platelets: 141 10*3/uL — ABNORMAL LOW (ref 150–400)
RBC: 4.47 MIL/uL (ref 4.22–5.81)
RDW: 12.9 % (ref 11.5–15.5)
WBC: 16.8 10*3/uL — ABNORMAL HIGH (ref 4.0–10.5)

## 2016-05-26 LAB — COMPREHENSIVE METABOLIC PANEL
ALT: 70 U/L — ABNORMAL HIGH (ref 17–63)
AST: 66 U/L — AB (ref 15–41)
Albumin: 4.3 g/dL (ref 3.5–5.0)
Alkaline Phosphatase: 62 U/L (ref 38–126)
Anion gap: 22 — ABNORMAL HIGH (ref 5–15)
BILIRUBIN TOTAL: 2.2 mg/dL — AB (ref 0.3–1.2)
BUN: 17 mg/dL (ref 6–20)
CO2: 19 mmol/L — ABNORMAL LOW (ref 22–32)
Calcium: 10.2 mg/dL (ref 8.9–10.3)
Chloride: 95 mmol/L — ABNORMAL LOW (ref 101–111)
Creatinine, Ser: 0.98 mg/dL (ref 0.61–1.24)
Glucose, Bld: 107 mg/dL — ABNORMAL HIGH (ref 65–99)
POTASSIUM: 4.1 mmol/L (ref 3.5–5.1)
Sodium: 136 mmol/L (ref 135–145)
TOTAL PROTEIN: 6.7 g/dL (ref 6.5–8.1)

## 2016-05-26 LAB — I-STAT CG4 LACTIC ACID, ED: Lactic Acid, Venous: 6.1 mmol/L (ref 0.5–1.9)

## 2016-05-26 LAB — I-STAT TROPONIN, ED: Troponin i, poc: 0 ng/mL (ref 0.00–0.08)

## 2016-05-26 LAB — LACTIC ACID, PLASMA: Lactic Acid, Venous: 2.2 mmol/L (ref 0.5–1.9)

## 2016-05-26 MED ORDER — LORAZEPAM 2 MG/ML IJ SOLN
0.0000 mg | Freq: Four times a day (QID) | INTRAMUSCULAR | Status: AC
  Administered 2016-05-26 (×2): 2 mg via INTRAVENOUS
  Administered 2016-05-27: 1 mg via INTRAVENOUS
  Administered 2016-05-27: 2 mg via INTRAVENOUS
  Administered 2016-05-27: 1 mg via INTRAVENOUS
  Administered 2016-05-27 – 2016-05-28 (×3): 2 mg via INTRAVENOUS
  Filled 2016-05-26 (×6): qty 1
  Filled 2016-05-26: qty 2
  Filled 2016-05-26: qty 1

## 2016-05-26 MED ORDER — LISINOPRIL 20 MG PO TABS: 20.0000 mg | ORAL_TABLET | Freq: Every day | ORAL | Status: DC

## 2016-05-26 MED ORDER — ONDANSETRON HCL 4 MG/2ML IJ SOLN
4.0000 mg | Freq: Once | INTRAMUSCULAR | Status: AC
  Administered 2016-05-26: 4 mg via INTRAVENOUS
  Filled 2016-05-26: qty 2

## 2016-05-26 MED ORDER — LORAZEPAM 2 MG/ML IJ SOLN
0.0000 mg | Freq: Two times a day (BID) | INTRAMUSCULAR | Status: DC
Start: 2016-05-28 — End: 2016-05-28

## 2016-05-26 MED ORDER — SODIUM CHLORIDE 0.9 % IV SOLN
INTRAVENOUS | Status: DC
  Administered 2016-05-26 – 2016-05-27 (×2): via INTRAVENOUS

## 2016-05-26 MED ORDER — VITAMIN B-1 100 MG PO TABS
100.0000 mg | ORAL_TABLET | Freq: Every day | ORAL | Status: DC
  Administered 2016-05-26 – 2016-05-28 (×3): 100 mg via ORAL
  Filled 2016-05-26 (×3): qty 1

## 2016-05-26 MED ORDER — POLYETHYLENE GLYCOL 3350 17 G PO PACK: 17.0000 g | PACK | Freq: Every day | ORAL | Status: DC | PRN

## 2016-05-26 MED ORDER — LORAZEPAM 1 MG PO TABS: 0.0000 mg | ORAL_TABLET | Freq: Four times a day (QID) | ORAL | Status: DC

## 2016-05-26 MED ORDER — IOPAMIDOL (ISOVUE-370) INJECTION 76%
INTRAVENOUS | Status: AC
  Filled 2016-05-26: qty 50

## 2016-05-26 MED ORDER — ENOXAPARIN SODIUM 40 MG/0.4ML ~~LOC~~ SOLN: 40.0000 mg | SUBCUTANEOUS | Status: DC

## 2016-05-26 MED ORDER — SODIUM CHLORIDE 0.9 % IV BOLUS (SEPSIS)
1000.0000 mL | Freq: Once | INTRAVENOUS | Status: AC
  Administered 2016-05-26: 1000 mL via INTRAVENOUS

## 2016-05-26 MED ORDER — THIAMINE HCL 100 MG/ML IJ SOLN
100.0000 mg | Freq: Every day | INTRAMUSCULAR | Status: DC
  Filled 2016-05-26: qty 2

## 2016-05-26 MED ORDER — LORAZEPAM 1 MG PO TABS: 0.0000 mg | ORAL_TABLET | Freq: Two times a day (BID) | ORAL | Status: DC

## 2016-05-26 MED ORDER — PANTOPRAZOLE SODIUM 40 MG IV SOLR: 40.0000 mg | Freq: Two times a day (BID) | INTRAVENOUS | Status: DC

## 2016-05-26 NOTE — Progress Notes (Signed)
Routine EEG completed, results pending. 

## 2016-05-26 NOTE — Procedures (Signed)
ELECTROENCEPHALOGRAM REPORT  Date of Study: 05/26/2016  Patient's Name: Nathaniel Pena MRN: [id number] Date of Birth: 1956/04/20  Referring Provider: Brenton Grills, PA-C  Clinical History: 61 y.o. male with medical history significant of heavy daily alcohol use, HTN , depression, PVC's, erectile dysfunction who presented to the ED after he sustained two unwitnessed falls  Medications: lisinopril (PRINIVIL,ZESTRIL) tablet 20 mg  LORazepam (ATIVAN) injection 0-4 mg   thiamine (VITAMIN B-1) tablet 100 mg  aspirin 81 MG chewable tablet  Aspirin-Salicylamide-Caffeine (BC HEADACHE POWDER PO)  tadalafil (CIALIS) 20 MG tablet   Technical Summary: A multichannel digital EEG recording measured by the international 10-20 system with electrodes applied with paste and impedances below 5000 ohms performed in our laboratory with EKG monitoring in an awake and drowsy patient.  Hyperventilation not performed.  Photic stimulation was performed.  The digital EEG was referentially recorded, reformatted, and digitally filtered in a variety of bipolar and referential montages for optimal display.    Description: The patient is awake and drowsy during the recording.  During maximal wakefulness, there is a symmetric, medium voltage 8 Hz posterior dominant rhythm that attenuates with eye opening.  The record is symmetric.  During drowsiness, there is an increase in theta slowing of the background.  Stage 2 sleep not seen.  Photic stimulation did not elicit any abnormalities.  There were no epileptiform discharges or electrographic seizures seen.    EKG lead was unremarkable.  Impression: This awake and drowsy EEG is normal.    Clinical Correlation: A normal EEG does not exclude a clinical diagnosis of epilepsy.  If further clinical questions remain, prolonged EEG may be helpful.  Clinical correlation is advised.   Metta Clines, DO

## 2016-05-26 NOTE — ED Notes (Signed)
Pt has abrasion to his forehead from a fall this morning.

## 2016-05-26 NOTE — ED Provider Notes (Signed)
[facility] DEPT Provider Note   CSN: [id number] Arrival date & time: 05/26/16  [id number]     History   Chief Complaint Chief Complaint  Patient presents with  . Loss of Consciousness  . Chest Pain    HPI Nathaniel Pena is a 61 y.o. male.  The history is provided by the patient, a significant other and a relative. No language interpreter was used.     61 year old male the past medical history of generalized anxiety disorder, tobacco abuse, alcohol abuse, hypertension who presents today with chest pain and syncope. Patient states that he woke up around 5 AM this morning and had significant anxiety. States he also had a squeezing chest pain that radiated into his back with this. States that at some point he got up in his bedroom and had an episode of syncope twice. States that while point he hit his head in his bedroom. Patient states that he has had 3 days of nausea and vomiting before this. Has not had any fevers, chills, diarrhea, dysuria, hematuria. He also relates that typically he consumes approximately 6 cans of beer today but is a been unable to drink for the last 3 days due to his nausea and vomiting symptoms. Denies any prior history of cardiac issues. Patient states prior to him having reported syncope, he had symptoms of full similar to a panic attack. He denies any similar symptoms to this. No worsening alleviating factors. Patient feels very shaky and anxious right now. Denies any unilateral numbness or tingling or focal weakness. Patient states he is not on any anticoagulation currently.  His daughter, who is present in the house, states that this morning her father called her into his room and states he was having chest pain and folic is having a heart attack. She gave him aspirin and they ultimately brought him here for further evaluation. Patient has not had a heart attack in the past per the daughter. She also states that at some point this morning, the patient was seen by her  boyfriend lying in an awkward position in the bed. He was already fully dressed and they state he had gotten up and smoked a cigarette. No seizure like activity was every witnessed by family but they state he did urinate on himself at some point today.   Past Medical History:  Diagnosis Date  . Colonic polyp   . GAD (generalized anxiety disorder)   . Hypertension   . Tobacco abuse    smoker for 25 years. quit in 2002    Patient Active Problem List   Diagnosis Date Noted  . Alcohol withdrawal ([place]) 05/26/2016  . Other stressful life events affecting family and household 11/21/2015  . Depression 11/21/2015  . PVC's (premature ventricular contractions) 11/21/2015  . ED (erectile dysfunction) 03/26/2012  . Heavy alcohol use 03/26/2012  . Hypertension 03/04/2011    Past Surgical History:  Procedure Laterality Date  . COLONOSCOPY W/ POLYPECTOMY  2008       Home Medications    Prior to Admission medications   Medication Sig Start Date End Date Taking? Authorizing Provider  aspirin 81 MG chewable tablet Chew 81 mg by mouth every 6 (six) hours as needed for mild pain.   Yes Historical Provider, MD  Aspirin-Salicylamide-Caffeine (BC HEADACHE POWDER PO) Take 1 packet by mouth every 8 (eight) hours as needed (headache).   Yes Historical Provider, MD  lisinopril-hydrochlorothiazide (PRINZIDE,ZESTORETIC) 20-12.5 MG tablet Take 1 tablet by mouth daily. 11/21/15  Yes Denita Lung,  MD  tadalafil (CIALIS) 20 MG tablet Take 1 tablet (20 mg total) by mouth daily as needed for erectile dysfunction. 05/15/16  Yes Denita Lung, MD    Family History No family history on file.  Social History Social History  Substance Use Topics  . Smoking status: Current Every Day Smoker    Packs/day: 1.00  . Smokeless tobacco: Current User    Types: Snuff  . Alcohol use 32.4 oz/week    42 Cans of beer, 12 Standard drinks or equivalent per week     Allergies   Patient has no known  allergies.   Review of Systems Review of Systems  Constitutional: Positive for diaphoresis. Negative for chills and fever.  HENT: Negative for ear pain and sore throat.   Eyes: Negative for pain and visual disturbance.  Respiratory: Negative for cough and shortness of breath.   Cardiovascular: Positive for chest pain (as described in HPI). Negative for palpitations.  Gastrointestinal: Positive for nausea and vomiting. Negative for abdominal pain.  Genitourinary: Negative for dysuria and hematuria.  Musculoskeletal: Negative for arthralgias and back pain.  Skin: Positive for wound (forehead after fall earlier this morning). Negative for color change and rash.  Neurological: Positive for tremors and syncope. Negative for seizures.  Psychiatric/Behavioral: The patient is nervous/anxious.   All other systems reviewed and are negative.    Physical Exam Updated Vital Signs BP 123/81   Pulse 100   Temp 97.8 F (36.6 C) (Oral)   Resp 17   Ht 6\' 4"  (1.93 m)   Wt 88.5 kg   SpO2 98%   BMI 23.74 kg/m   Physical Exam  Constitutional: He is oriented to person, place, and time. He appears well-developed and well-nourished. He appears ill.  HENT:  Head: Normocephalic and atraumatic.  Eyes: Conjunctivae and EOM are normal. Pupils are equal, round, and reactive to light.  Neck: Normal range of motion. Neck supple.  Cardiovascular: Normal rate and regular rhythm.   Pulmonary/Chest: Effort normal and breath sounds normal. No respiratory distress.  Abdominal: Soft. He exhibits no distension. There is no tenderness.  Musculoskeletal: Normal range of motion. He exhibits no edema.  Neurological: He is alert and oriented to person, place, and time. He has normal strength. No cranial nerve deficit (CN II-XII intact) or sensory deficit. Coordination normal. GCS eye subscore is 4. GCS verbal subscore is 5. GCS motor subscore is 6.  Reflex Scores:      Patellar reflexes are 3+ on the right side and  3+ on the left side. Skin: Skin is warm and dry.  Psychiatric: He has a normal mood and affect.  Nursing note and vitals reviewed.    ED Treatments / Results  Labs (all labs ordered are listed, but only abnormal results are displayed) Labs Reviewed  CBC WITH DIFFERENTIAL/PLATELET - Abnormal; Notable for the following:       Result Value   WBC 16.8 (*)    Platelets 141 (*)    Neutro Abs 15.1 (*)    All other components within normal limits  COMPREHENSIVE METABOLIC PANEL - Abnormal; Notable for the following:    Chloride 95 (*)    CO2 19 (*)    Glucose, Bld 107 (*)    AST 66 (*)    ALT 70 (*)    Total Bilirubin 2.2 (*)    Anion gap 22 (*)    All other components within normal limits  I-STAT CG4 LACTIC ACID, ED - Abnormal; Notable for the following:  Lactic Acid, Venous 6.10 (*)    All other components within normal limits  LACTIC ACID, PLASMA  LACTIC ACID, PLASMA  URINALYSIS, ROUTINE W REFLEX MICROSCOPIC  I-STAT TROPOININ, ED    EKG  EKG Interpretation  Date/Time:  Monday May 26 2016 09:05:15 EST Ventricular Rate:  92 PR Interval:    QRS Duration: 102 QT Interval:  359 QTC Calculation: 445 R Axis:   47 Text Interpretation:  regular narrow complex QRS, but w substantial artefact Poor study - discard Reconfirmed by Carmin Muskrat  MD [phone]) on 05/26/2016 9:28:49 AM       Radiology Ct Head Wo Contrast  Result Date: 05/26/2016 CLINICAL DATA:  Persistent nausea for couple weeks. EXAM: CT HEAD WITHOUT CONTRAST TECHNIQUE: Contiguous axial images were obtained from the base of the skull through the vertex without intravenous contrast. COMPARISON:  None. FINDINGS: Brain: No evidence of acute infarction, hemorrhage, extra-axial collection, ventriculomegaly, or mass effect. There is a left posterior fossae are arachnoid cyst versus prominent asymmetric cisterna magna. Generalized cerebral atrophy. Periventricular white matter low attenuation likely secondary to  microangiopathy. Vascular: Cerebrovascular atherosclerotic calcifications are noted. Skull: Negative for fracture or focal lesion. Sinuses/Orbits: Visualized portions of the orbits are unremarkable. The mastoid sinuses are clear. Small right maxillary sinus mucous retention cyst versus polyp. Other: None. IMPRESSION: 1. No acute intracranial pathology. 2. Chronic microvascular disease and cerebral atrophy. Electronically Signed   By: Kathreen Devoid   On: 05/26/2016 10:05   Dg Chest Port 1 View  Result Date: 05/26/2016 CLINICAL DATA:  Syncope, unwitnessed falls EXAM: PORTABLE CHEST 1 VIEW COMPARISON:  None. FINDINGS: The heart size and mediastinal contours are within normal limits. Both lungs are clear. The visualized skeletal structures are unremarkable. IMPRESSION: No active disease. Electronically Signed   By: Kathreen Devoid   On: 05/26/2016 13:42   US Abdomen Limited Ruq  Result Date: 05/26/2016 CLINICAL DATA:  61 year old male with transaminitis EXAM: US ABDOMEN LIMITED - RIGHT UPPER QUADRANT COMPARISON:  None. FINDINGS: Gallbladder: No gallstones or wall thickening visualized. No sonographic Murphy sign noted by sonographer. Common bile duct: Diameter: 6 mm Liver: No focal lesion.  Heterogeneous echotexture of liver parenchyma. IMPRESSION: Sonographic survey demonstrates no evidence of cholelithiasis. Heterogeneous echotexture of liver parenchyma, potentially representing medical liver disease. Signed, Dulcy Fanny. Earleen Newport, DO [facility] Radiology Electronically Signed   By: Corrie Mckusick D.O.   On: 05/26/2016 15:06    Procedures Procedures (including critical care time)  Medications Ordered in ED Medications  thiamine (VITAMIN B-1) tablet 100 mg (100 mg Oral Given 05/26/16 1032)    Or  thiamine (B-1) injection 100 mg ( Intravenous See Alternative 05/26/16 1032)  LORazepam (ATIVAN) injection 0-4 mg (2 mg Intravenous Given 05/26/16 1050)    Followed by   LORazepam (ATIVAN) injection 0-4 mg (not administered)  0.9 %  sodium chloride infusion (not administered)  polyethylene glycol (MIRALAX / GLYCOLAX) packet 17 g (not administered)  lisinopril (PRINIVIL,ZESTRIL) tablet 20 mg (not administered)  pantoprazole (PROTONIX) injection 40 mg (not administered)  sodium chloride 0.9 % bolus 1,000 mL (0 mLs Intravenous Stopped 05/26/16 1342)  ondansetron (ZOFRAN) injection 4 mg (4 mg Intravenous Given 05/26/16 1031)     Initial Impression / Assessment and Plan / ED Course  I have reviewed the triage vital signs and the nursing notes.  Pertinent labs & imaging results that were available during my care of the patient were reviewed by me and considered in my medical decision making (  see chart for details).  Clinical Course     Patient presents today with chest pain, syncope, tremors. On further discussion with the patient, patient admits to drinking up to a sixpack a day of beer. He is also notably tachycardic, has tremors, has anxiety. Admits to not being able to tolerate anything by mouth over the last 3 days. I suspect that the patient is likely having withdrawal symptoms.  No acute neurological deficits on my exam. Patient is awake and alert and oriented. Started see what protocol and obtained labs. Review these labs demonstrates a lactic acidosis of 6.1. Patient likely had a seizure this morning secondary to his withdrawal symptoms and this led to a type B lactic acidosis. No evidence of sepsis or infectious etiology.  No findings on head CT today. Discussed with hospitalist service who is agreeable to admit the patient to the stepdown unit. Patient is in fair condition at the time of admission.  Final Clinical Impressions(s) / ED Diagnoses   Final diagnoses:  Alcohol withdrawal syndrome with complication [facility])    New Prescriptions New Prescriptions   No medications on file     Theodosia Quay, MD 05/26/16 1511    Carmin Muskrat,  MD 05/27/16 2042

## 2016-05-26 NOTE — ED Triage Notes (Signed)
Per Pt and family, Pt is coming from home with complaints of nausea, falls, and chest pain that started today. Pt had an episode of incontinence today that was abnormal for patient. Pt reports falling out of the bed this morning and then getting back in. Family reports hx of panic attacks, not eating due to stress, and Hx of falls in the yard. Pt is alert and oriented x4.

## 2016-05-26 NOTE — H&P (Signed)
History and Physical    Nathaniel Pena [id number] DOB: [birth year] DOA: 05/26/2016  PCP: Wyatt Haste, MD Patient coming from: Home Chief Complaint:  HPI: Nathaniel Pena is a 61 y.o. male with medical history significant of heavy daily alcohol use, HTN , depression, PVC's, erectile dysfunction who presented to the ED after he sustained two unwitnessed falls at home that occurred 45 minutes apart. He doesn't remember how he fell off the bed first time, but he found himself on the floor and crawled back to bed to sleep. He believes that he might have been unconscious and might even had a seizure episode as he dose have a history of seizure and was taken off antiepileptic medication years ago by now retired neurologist - Dr. Erling Cruz. The second full occurred in the bathroom the patient was awaken by urge to defecate.  He went to the bathroom but couldn't make it on time feeling like loose bowel movement is coming down his underwear. He sat on a commode and started falling face down. Patientr eported having nausea and vomiting for few days and in addition developed chest pain since yesterday that he believes this could be actually indigestion.   ED Course: Upon arrival to the ED he was hypotensive with BP 79/56 mmHg, blood cells count was elevated at 16,800, he had anion gap of 22, and elevated lactic acid at 6.10. CMP showed abnormal liver profile with total bilirubin of 2.2, AST 66 and ALT 70, normal troponin EKG and telemetry showed regular heart rhythm with multiple artifacts d/t severe tremor Head CT was negative for acute findings  Review of Systems: As per HPI otherwise 10 point review of systems negative.   Ambulatory Status: independent  Past Medical History:  Diagnosis Date  . Colonic polyp   . GAD (generalized anxiety disorder)   . Hypertension   . Tobacco abuse    smoker for 25 years. quit in 2002    Past Surgical History:  Procedure Laterality Date  . COLONOSCOPY W/  POLYPECTOMY  2008    Social History   Social History  . Marital status: Married    Spouse name: N/A  . Number of children: N/A  . Years of education: N/A   Occupational History  . Not on file.   Social History Main Topics  . Smoking status: Current Every Day Smoker    Packs/day: 1.00  . Smokeless tobacco: Current User    Types: Snuff  . Alcohol use 32.4 oz/week    42 Cans of beer, 12 Standard drinks or equivalent per week  . Drug use: No  . Sexual activity: Yes   Other Topics Concern  . Not on file   Social History Narrative  . No narrative on file    No Known Allergies Patient dose not remember any significant illnesses in his family   Prior to Admission medications   Medication Sig Start Date End Date Taking? Authorizing Provider  lisinopril-hydrochlorothiazide (PRINZIDE,ZESTORETIC) 20-12.5 MG tablet Take 1 tablet by mouth daily. 11/21/15   Denita Lung, MD  tadalafil (CIALIS) 20 MG tablet Take 1 tablet (20 mg total) by mouth daily as needed for erectile dysfunction. 05/15/16   Denita Lung, MD  vardenafil (LEVITRA) 20 MG tablet TAKE 1 TABLET (20 MG TOTAL) BY MOUTH DAILY AS NEEDED FOR ERECTILE DYSFUNCTION. 11/21/15   Denita Lung, MD    Physical Exam: Vitals:   05/26/16 1045 05/26/16 1100 05/26/16 1115 05/26/16 1130  BP: 136/95 147/88 137/94 136/80  Pulse: 93 93 89 97  Resp: 13 14 19 14   Temp:      TempSrc:      SpO2: 96% 96% 97% 97%  Weight:      Height:         General: Appears agitated and tremulous Eyes: PERRLA, EOMI, normal lids, iris ENT:  grossly normal hearing, lips & tongue, mucous membranes moist and intact Neck: no lymphoadenopathy, masses or thyromegaly Cardiovascular: RRR, no m/r/g. No JVD, carotid bruits. No LE edema.  Respiratory: bilateral no wheezes, rales, rhonchi or cracles. Normal respiratory effort. No accessory muscle use observed Abdomen: soft, non-tender, non-distended, no organomegaly or masses appreciated. BS present in all  quadrants Skin: no rash, ulcers or induration seen on limited exam, skin laceration over the left eye brow Musculoskeletal: grossly normal tone BUE/BLE, good ROM, no bony abnormality or joint deformities observed Psychiatric: grossly normal mood and affect, speech fluent and appropriate, alert and oriented x3 Neurologic: CN II-XII grossly intact, moves all extremities in coordinated fashion, sensation intact  Labs on Admission: I have personally reviewed following labs and imaging studies  CBC, CMP, Lactic acid, troponin  GFR: Estimated Creatinine Clearance: 97.2 mL/min (by C-G formula based on SCr of 0.98 mg/dL).   Creatinine Clearance: Estimated Creatinine Clearance: 97.2 mL/min (by C-G formula based on SCr of 0.98 mg/dL).    Radiological Exams on Admission: Ct Head Wo Contrast  Result Date: 05/26/2016 CLINICAL DATA:  Persistent nausea for couple weeks. EXAM: CT HEAD WITHOUT CONTRAST TECHNIQUE: Contiguous axial images were obtained from the base of the skull through the vertex without intravenous contrast. COMPARISON:  None. FINDINGS: Brain: No evidence of acute infarction, hemorrhage, extra-axial collection, ventriculomegaly, or mass effect. There is a left posterior fossae are arachnoid cyst versus prominent asymmetric cisterna magna. Generalized cerebral atrophy. Periventricular white matter low attenuation likely secondary to microangiopathy. Vascular: Cerebrovascular atherosclerotic calcifications are noted. Skull: Negative for fracture or focal lesion. Sinuses/Orbits: Visualized portions of the orbits are unremarkable. The mastoid sinuses are clear. Small right maxillary sinus mucous retention cyst versus polyp. Other: None. IMPRESSION: 1. No acute intracranial pathology. 2. Chronic microvascular disease and cerebral atrophy. Electronically Signed   By: Kathreen Devoid   On: 05/26/2016 10:05    EKG: Independently reviewed - EKG and telemetry showed regular heart rhythm with multiple  artifacts d/t severe tremor Assessment/Plan Principal Problem:   Alcohol withdrawal ([facility]) Active Problems:   Hypertension   Heavy alcohol use   Depression   Alcohol withdrawal  Had two unwitnessed falls and unclear whether or not he had seizure Continue stepdown CIWA protocol EEG ordered Might benefit from psych consult or social worker consult to locate outpatient resources alcohol cessation councelling  HTN Currently controlled and hypotensive on admission Will restart meds with parameters  Epigastric pain, nausea, vomiting  Suspect alcoholic hepatitis/possible cirrhosis and alcoholic gastritis Will order liver US and follow transaminases Start PPI for epigastric / lower chest pain radiating to the back, troponin is 0.00  Depression Currently not on any antidepressants Initiation of antidepressants would be unsafe given his hisotry of daily alcohol use  Lactic Acidosis with elevated anion GAP Most likely associated with alcohol abuse However, his WBC count is elevated at 16,800 - will check UA, chest XRay and follow WBC trend   DVT prophylaxis: Code Status: SCD Family Communication: none Disposition Plan: stepdown Consults called: none Admission status: inpatient   York Grice, [place]  Pager: [phone] [facility]  If 7PM-7AM, please contact night-coverage [email or web address] Password [email or web address]  05/26/2016, 12:31 PM

## 2016-05-26 NOTE — ED Notes (Signed)
Pt. In EEG

## 2016-05-26 NOTE — ED Notes (Signed)
Patient transported to Ultrasound 

## 2016-05-27 ENCOUNTER — Inpatient Hospital Stay (HOSPITAL_COMMUNITY): Payer: 59

## 2016-05-27 DIAGNOSIS — F10939 Alcohol use, unspecified with withdrawal, unspecified: Secondary | ICD-10-CM

## 2016-05-27 DIAGNOSIS — F322 Major depressive disorder, single episode, severe without psychotic features: Secondary | ICD-10-CM

## 2016-05-27 DIAGNOSIS — R55 Syncope and collapse: Secondary | ICD-10-CM

## 2016-05-27 DIAGNOSIS — R112 Nausea with vomiting, unspecified: Secondary | ICD-10-CM

## 2016-05-27 DIAGNOSIS — R197 Diarrhea, unspecified: Secondary | ICD-10-CM

## 2016-05-27 DIAGNOSIS — F10239 Alcohol dependence with withdrawal, unspecified: Secondary | ICD-10-CM

## 2016-05-27 LAB — COMPREHENSIVE METABOLIC PANEL
ALBUMIN: 3.5 g/dL (ref 3.5–5.0)
ALT: 53 U/L (ref 17–63)
AST: 46 U/L — AB (ref 15–41)
Alkaline Phosphatase: 49 U/L (ref 38–126)
Anion gap: 11 (ref 5–15)
BILIRUBIN TOTAL: 1.8 mg/dL — AB (ref 0.3–1.2)
BUN: 19 mg/dL (ref 6–20)
CHLORIDE: 99 mmol/L — AB (ref 101–111)
CO2: 25 mmol/L (ref 22–32)
Calcium: 9.1 mg/dL (ref 8.9–10.3)
Creatinine, Ser: 0.79 mg/dL (ref 0.61–1.24)
GFR calc Af Amer: >60 mL/min (ref >60)
GFR calc non Af Amer: >60 mL/min (ref >60)
GLUCOSE: 116 mg/dL — AB (ref 65–99)
POTASSIUM: 3.5 mmol/L (ref 3.5–5.1)
Sodium: 135 mmol/L (ref 135–145)
Total Protein: 5.5 g/dL — ABNORMAL LOW (ref 6.5–8.1)

## 2016-05-27 LAB — BASIC METABOLIC PANEL
Anion gap: 9 (ref 5–15)
BUN: 17 mg/dL (ref 6–20)
CHLORIDE: 98 mmol/L — AB (ref 101–111)
CO2: 27 mmol/L (ref 22–32)
Calcium: 9 mg/dL (ref 8.9–10.3)
Creatinine, Ser: 0.74 mg/dL (ref 0.61–1.24)
GFR calc Af Amer: >60 mL/min (ref >60)
GFR calc non Af Amer: >60 mL/min (ref >60)
GLUCOSE: 114 mg/dL — AB (ref 65–99)
POTASSIUM: 3.4 mmol/L — AB (ref 3.5–5.1)
Sodium: 134 mmol/L — ABNORMAL LOW (ref 135–145)

## 2016-05-27 LAB — MRSA PCR SCREENING: MRSA BY PCR: NEGATIVE

## 2016-05-27 LAB — LACTIC ACID, PLASMA
LACTIC ACID, VENOUS: 1.1 mmol/L (ref 0.5–1.9)
LACTIC ACID, VENOUS: 2.2 mmol/L — AB (ref 0.5–1.9)

## 2016-05-27 LAB — CBC
HCT: 39 % (ref 39.0–52.0)
Hemoglobin: 13.8 g/dL (ref 13.0–17.0)
MCH: 33.9 pg (ref 26.0–34.0)
MCHC: 35.4 g/dL (ref 30.0–36.0)
MCV: 95.8 fL (ref 78.0–100.0)
Platelets: 106 10*3/uL — ABNORMAL LOW (ref 150–400)
RBC: 4.07 MIL/uL — ABNORMAL LOW (ref 4.22–5.81)
RDW: 12.4 % (ref 11.5–15.5)
WBC: 8.1 10*3/uL (ref 4.0–10.5)

## 2016-05-27 LAB — ECHOCARDIOGRAM COMPLETE
E decel time: 461 msec
E/e' ratio: 3.66
FS: 26 % — AB (ref 28–44)
Height: 76 in
IVS/LV PW RATIO, ED: 1.05
LA diam index: 1.62 cm/m2
LA vol index: 22.3 mL/m2
LA vol: 48.1 mL
LASIZE: 35 mm
LAVOLA4C: 42.2 mL
LDCA: 3.8 cm2
LEFT ATRIUM END SYS DIAM: 35 mm
LV E/e' medial: 3.66
LV E/e'average: 3.66
LV PW d: 14.5 mm — AB (ref 0.6–1.1)
LV TDI E'MEDIAL: 4.35
LV e' LATERAL: 10.6 cm/s
LVOTD: 22 mm
MV Dec: 461
MV pk A vel: 64 m/s
MV pk E vel: 38.8 m/s
RV LATERAL S' VELOCITY: 15.9 cm/s
TDI e' lateral: 10.6
Weight: 3061.75 oz

## 2016-05-27 LAB — RAPID URINE DRUG SCREEN, HOSP PERFORMED
Amphetamines: NOT DETECTED
Barbiturates: NOT DETECTED
Benzodiazepines: POSITIVE — AB
Cocaine: NOT DETECTED
OPIATES: NOT DETECTED
Tetrahydrocannabinol: POSITIVE — AB

## 2016-05-27 LAB — LIPASE, BLOOD: Lipase: 25 U/L (ref 11–51)

## 2016-05-27 LAB — CK: Total CK: 61 U/L (ref 49–397)

## 2016-05-27 LAB — ETHANOL: Alcohol, Ethyl (B): <5 mg/dL (ref <5)

## 2016-05-27 MED ORDER — SODIUM CHLORIDE 0.9 % IV BOLUS (SEPSIS)
1000.0000 mL | Freq: Once | INTRAVENOUS | Status: AC
Start: 2016-05-27 — End: 2016-05-27
  Administered 2016-05-27: 1000 mL via INTRAVENOUS

## 2016-05-27 MED ORDER — SODIUM CHLORIDE 0.9 % IV SOLN: INTRAVENOUS | Status: DC

## 2016-05-27 MED ORDER — LISINOPRIL 20 MG PO TABS
20.0000 mg | ORAL_TABLET | Freq: Every day | ORAL | Status: DC
  Administered 2016-05-27 – 2016-05-28 (×2): 20 mg via ORAL
  Filled 2016-05-27 (×2): qty 1

## 2016-05-27 MED ORDER — PANTOPRAZOLE SODIUM 40 MG IV SOLR
40.0000 mg | Freq: Two times a day (BID) | INTRAVENOUS | Status: DC
  Administered 2016-05-27 – 2016-05-28 (×3): 40 mg via INTRAVENOUS
  Filled 2016-05-27 (×3): qty 40

## 2016-05-27 MED ORDER — PERFLUTREN LIPID MICROSPHERE
INTRAVENOUS | Status: AC
  Administered 2016-05-27: 3 mL
  Filled 2016-05-27: qty 10

## 2016-05-27 MED ORDER — NICOTINE 21 MG/24HR TD PT24
21.0000 mg | MEDICATED_PATCH | Freq: Every day | TRANSDERMAL | Status: DC
  Administered 2016-05-27 (×2): 21 mg via TRANSDERMAL
  Filled 2016-05-27 (×2): qty 1

## 2016-05-27 NOTE — Progress Notes (Signed)
   05/27/16 1000  Clinical Encounter Type  Visited With Patient  Visit Type Other (Comment) ([place] consult)  Spiritual Encounters  Spiritual Needs Emotional  Stress Factors  Patient Stress Factors Health changes  Introduction to Pt. Provided emotional support and listened to his story.

## 2016-05-27 NOTE — Progress Notes (Signed)
  Echocardiogram 2D Echocardiogram with Definity has been performed.  Nathaniel Pena 05/27/2016, 1:39 PM

## 2016-05-27 NOTE — Progress Notes (Signed)
PROGRESS NOTE    Nathaniel Pena  [id number] DOB: Mar 30, 1956 DOA: 05/26/2016 PCP: Wyatt Haste, MD   Brief Narrative:  61 y.o. male PMHx Depression, Seizures HEAVY EtOH abuse, HTN ,  PVC's, Eerectile dysfunction   Who presented to the ED after he sustained two unwitnessed falls at home that occurred 45 minutes apart. He doesn't remember how he fell off the bed first time, but he found himself on the floor and crawled back to bed to sleep. He believes that he might have been unconscious and might even had a seizure episode as he dose have a history of seizure and was taken off antiepileptic medication years ago by now retired neurologist - Dr. Erling Cruz. The second full occurred in the bathroom the patient was awaken by urge to defecate.  He went to the bathroom but couldn't make it on time feeling like loose bowel movement is coming down his underwear. He sat on a commode and started falling face down. Patientr eported having nausea and vomiting for few days and in addition developed chest pain since yesterday that he believes this could be actually indigestion.    Subjective: 1/9 A/O 4, patient states continues to have diarrhea but has decreased in amount. Admits he was falling out but now attributes this to a stomach virus. Negative abdominal pain, negative N/V, negative CP     Assessment & Plan:   Principal Problem:   Alcohol withdrawal ([place]) Active Problems:   Hypertension   Heavy alcohol use   Depression   Alcohol withdrawal  -Had two unwitnessed falls and unclear whether or not he had seizure -Continue stepdown CIWA protocol -EEG: Normal -Might benefit from psych consult or social worker consult to locate outpatient resources alcohol cessation councelling  -1/9 EtOH level <5 -UDS pending  HTN -Lisinopril 20 mg daily  Epigastric pain, nausea, vomiting  -Suspect alcoholic hepatitis/possible cirrhosis and alcoholic gastritis -Will order liver US and follow  transaminases Start PPI for epigastric / lower chest pain radiating to the back, troponin is 0.00  Diarrhea -Most likely secondary to alcohol abuse however given his high white blood cell count would be prudent to check stool for pathogens. GI panel pending excellent -hold antibiotics at this time.  Depression -Currently not on any antidepressants -Initiation of antidepressants would be unsafe given his hisotry of daily alcohol use  Lactic Acidosis with elevated anion GAP -Most likely associated with alcohol abuse -However, his WBC count is elevated at 16,800 - will check UA, chest XRay and follow WBC trend    DVT prophylaxis: SCD Code Status: Full Family Communication: None Disposition Plan: ??   Consultants:  None  Procedures/Significant Events:  1/8 EEG: Normal Echocardiogram pending  VENTILATOR SETTINGS: NA   Cultures 1/9 GI panel pending  Antimicrobials: Anti-infectives    None       Devices    LINES / TUBES:      Continuous Infusions: . sodium chloride 100 mL/hr at 05/27/16 0415     Objective: Vitals:   05/27/16 0350 05/27/16 0558 05/27/16 0809 05/27/16 0844  BP: (!) 141/80 128/86 90/60 134/88  Pulse: 73 80 71   Resp: 14 20 20    Temp: 98 F (36.7 C)  98 F (36.7 C)   TempSrc: Oral  Oral   SpO2: 95% 95% 96%   Weight: 86.8 kg (191 lb 5.8 oz)     Height: 6\' 4"  (1.93 m)       Intake/Output Summary (Last 24 hours) at 05/27/16 0847 Last data filed  at 05/27/16 0600  Gross per 24 hour  Intake             1415 ml  Output                0 ml  Net             1415 ml   Filed Weights   05/26/16 0828 05/27/16 0350  Weight: 88.5 kg (195 lb) 86.8 kg (191 lb 5.8 oz)    Examination:  General: A/O 4, NAD, No acute respiratory distress Eyes: negative scleral hemorrhage, negative anisocoria, negative icterus ENT: Negative Runny nose, negative gingival bleeding, Neck:  Negative scars, masses, torticollis, lymphadenopathy, JVD Lungs: Clear  to auscultation bilaterally without wheezes or crackles Cardiovascular: Regular rate and rhythm without murmur gallop or rub normal S1 and S2 Abdomen: negative abdominal pain, nondistended, positive soft, bowel sounds, no rebound, no ascites, no appreciable mass Extremities: No significant cyanosis, clubbing, or edema bilateral lower extremities Skin: laceration on left for head consistent with fall.  Psychiatric:  Very poor understanding of disease process (patient in denial about how serious alcohol problem appears to be.)  Central nervous system:  Cranial nerves II through XII intact, tongue/uvula midline, all extremities muscle strength 5/5, sensation intact throughout, negative dysarthria, negative expressive aphasia, negative receptive aphasia.  .     Data Reviewed: Care during the described time interval was provided by me .  I have reviewed this patient's available data, including medical history, events of note, physical examination, and all test results as part of my evaluation. I have personally reviewed and interpreted all radiology studies.  CBC:  Recent Labs Lab 05/26/16 0915 05/27/16 0104  WBC 16.8* 8.1  NEUTROABS 15.1*  --   HGB 15.2 13.8  HCT 43.8 39.0  MCV 98.0 95.8  PLT 141* [id number]*   Basic Metabolic Panel:  Recent Labs Lab 05/26/16 0915 05/27/16 0104  NA 136 134*  135  K 4.1 3.4*  3.5  CL 95* 98*  99*  CO2 19* 27  25  GLUCOSE 107* 114*  116*  BUN 17 17  19   CREATININE 0.98 0.74  0.79  CALCIUM 10.2 9.0  9.1   GFR: Estimated Creatinine Clearance: 119 mL/min (by C-G formula based on SCr of 0.79 mg/dL). Liver Function Tests:  Recent Labs Lab 05/26/16 0915 05/27/16 0104  AST 66* 46*  ALT 70* 53  ALKPHOS 62 49  BILITOT 2.2* 1.8*  PROT 6.7 5.5*  ALBUMIN 4.3 3.5    Recent Labs Lab 05/27/16 0104  LIPASE 25   No results for input(s): AMMONIA in the last 168 hours. Coagulation Profile: No results for input(s): INR, PROTIME in the last 168  hours. Cardiac Enzymes:  Recent Labs Lab 05/27/16 0104  CKTOTAL 61   BNP (last 3 results) No results for input(s): PROBNP in the last 8760 hours. HbA1C: No results for input(s): HGBA1C in the last 72 hours. CBG: No results for input(s): GLUCAP in the last 168 hours. Lipid Profile: No results for input(s): CHOL, HDL, LDLCALC, TRIG, CHOLHDL, LDLDIRECT in the last 72 hours. Thyroid Function Tests: No results for input(s): TSH, T4TOTAL, FREET4, T3FREE, THYROIDAB in the last 72 hours. Anemia Panel: No results for input(s): VITAMINB12, FOLATE, FERRITIN, TIBC, IRON, RETICCTPCT in the last 72 hours. Urine analysis:    Component Value Date/Time   BILIRUBINUR n 03/26/2012 1220   PROTEINUR n 03/26/2012 1220   UROBILINOGEN negative 03/26/2012 1220   NITRITE n 03/26/2012 1220   LEUKOCYTESUR Negative  03/26/2012 1220   Sepsis Labs: @LABRCNTIP (procalcitonin:4,lacticidven:4)  ) Recent Results (from the past 240 hour(s))  MRSA PCR Screening     Status: None   Collection Time: 05/27/16  4:00 AM  Result Value Ref Range Status   MRSA by PCR NEGATIVE NEGATIVE Final    Comment:        The GeneXpert MRSA Assay (FDA approved for NASAL specimens only), is one component of a comprehensive MRSA colonization surveillance program. It is not intended to diagnose MRSA infection nor to guide or monitor treatment for MRSA infections.          Radiology Studies: Ct Head Wo Contrast  Result Date: 05/26/2016 CLINICAL DATA:  Persistent nausea for couple weeks. EXAM: CT HEAD WITHOUT CONTRAST TECHNIQUE: Contiguous axial images were obtained from the base of the skull through the vertex without intravenous contrast. COMPARISON:  None. FINDINGS: Brain: No evidence of acute infarction, hemorrhage, extra-axial collection, ventriculomegaly, or mass effect. There is a left posterior fossae are arachnoid cyst versus prominent asymmetric cisterna magna. Generalized cerebral atrophy. Periventricular white  matter low attenuation likely secondary to microangiopathy. Vascular: Cerebrovascular atherosclerotic calcifications are noted. Skull: Negative for fracture or focal lesion. Sinuses/Orbits: Visualized portions of the orbits are unremarkable. The mastoid sinuses are clear. Small right maxillary sinus mucous retention cyst versus polyp. Other: None. IMPRESSION: 1. No acute intracranial pathology. 2. Chronic microvascular disease and cerebral atrophy. Electronically Signed   By: Kathreen Devoid   On: 05/26/2016 10:05   Dg Chest Port 1 View  Result Date: 05/26/2016 CLINICAL DATA:  Syncope, unwitnessed falls EXAM: PORTABLE CHEST 1 VIEW COMPARISON:  None. FINDINGS: The heart size and mediastinal contours are within normal limits. Both lungs are clear. The visualized skeletal structures are unremarkable. IMPRESSION: No active disease. Electronically Signed   By: Kathreen Devoid   On: 05/26/2016 13:42   US Abdomen Limited Ruq  Result Date: 05/26/2016 CLINICAL DATA:  61 year old male with transaminitis EXAM: US ABDOMEN LIMITED - RIGHT UPPER QUADRANT COMPARISON:  None. FINDINGS: Gallbladder: No gallstones or wall thickening visualized. No sonographic Murphy sign noted by sonographer. Common bile duct: Diameter: 6 mm Liver: No focal lesion.  Heterogeneous echotexture of liver parenchyma. IMPRESSION: Sonographic survey demonstrates no evidence of cholelithiasis. Heterogeneous echotexture of liver parenchyma, potentially representing medical liver disease. Signed, Dulcy Fanny. Earleen Newport, DO [facility] Electronically Signed   By: Corrie Mckusick D.O.   On: 05/26/2016 15:06        Scheduled Meds: . lisinopril  20 mg Oral Daily  . LORazepam  0-4 mg Intravenous Q6H   Followed by  . [START ON 05/28/2016] LORazepam  0-4 mg Intravenous Q12H  . nicotine  21 mg Transdermal Daily  . pantoprazole (PROTONIX) IV  40 mg Intravenous Q12H  . thiamine  100 mg Oral Daily   Or  .  thiamine  100 mg Intravenous Daily   Continuous Infusions: . sodium chloride 100 mL/hr at 05/27/16 0415     LOS: 1 day    Time spent: 40 minutes    WOODS, Geraldo Docker, MD [facility] Pager [phone]   If 7PM-7AM, please contact night-coverage [email or web address] Password [facility] 05/27/2016, 8:47 AM

## 2016-05-27 NOTE — Progress Notes (Signed)
CRITICAL VALUE ALERT  Critical value received:  Lactic acid 2.2  Date of notification:  05/27/16  Time of notification:  1609  Critical value read back:Yes.    Nurse who received alert:  Dwaine Gale, RN   MD notified (1st page):  Dr. Sherral Hammers  Time of first page:  1634  MD notified (2nd page):  Time of second page:  Responding MD:  Dr. Sherral Hammers  Time MD responded:  [phone]

## 2016-05-28 DIAGNOSIS — R197 Diarrhea, unspecified: Secondary | ICD-10-CM

## 2016-05-28 DIAGNOSIS — I10 Essential (primary) hypertension: Secondary | ICD-10-CM

## 2016-05-28 DIAGNOSIS — D696 Thrombocytopenia, unspecified: Secondary | ICD-10-CM

## 2016-05-28 DIAGNOSIS — R112 Nausea with vomiting, unspecified: Secondary | ICD-10-CM

## 2016-05-28 DIAGNOSIS — F411 Generalized anxiety disorder: Secondary | ICD-10-CM

## 2016-05-28 DIAGNOSIS — E872 Acidosis: Secondary | ICD-10-CM

## 2016-05-28 DIAGNOSIS — F1010 Alcohol abuse, uncomplicated: Secondary | ICD-10-CM

## 2016-05-28 DIAGNOSIS — R748 Abnormal levels of other serum enzymes: Secondary | ICD-10-CM

## 2016-05-28 LAB — HEPATITIS PANEL, ACUTE
HCV AB: 0.4 {s_co_ratio} (ref 0.0–0.9)
HEP B S AG: NEGATIVE
Hep A IgM: NEGATIVE
Hep B C IgM: NEGATIVE

## 2016-05-28 LAB — GASTROINTESTINAL PANEL BY PCR, STOOL (REPLACES STOOL CULTURE)

## 2016-05-28 LAB — COMPREHENSIVE METABOLIC PANEL
ALT: 73 U/L — ABNORMAL HIGH (ref 17–63)
AST: 97 U/L — AB (ref 15–41)
Albumin: 2.9 g/dL — ABNORMAL LOW (ref 3.5–5.0)
Alkaline Phosphatase: 43 U/L (ref 38–126)
Anion gap: 4 — ABNORMAL LOW (ref 5–15)
BILIRUBIN TOTAL: 1.1 mg/dL (ref 0.3–1.2)
BUN: 10 mg/dL (ref 6–20)
CO2: 27 mmol/L (ref 22–32)
Calcium: 8.4 mg/dL — ABNORMAL LOW (ref 8.9–10.3)
Chloride: 107 mmol/L (ref 101–111)
Creatinine, Ser: 0.7 mg/dL (ref 0.61–1.24)
Glucose, Bld: 95 mg/dL (ref 65–99)
POTASSIUM: 3.5 mmol/L (ref 3.5–5.1)
Sodium: 138 mmol/L (ref 135–145)
Total Protein: 4.8 g/dL — ABNORMAL LOW (ref 6.5–8.1)

## 2016-05-28 LAB — CBC
HEMATOCRIT: 34.7 % — AB (ref 39.0–52.0)
Hemoglobin: 12.2 g/dL — ABNORMAL LOW (ref 13.0–17.0)
MCH: 34.3 pg — ABNORMAL HIGH (ref 26.0–34.0)
MCHC: 35.2 g/dL (ref 30.0–36.0)
MCV: 97.5 fL (ref 78.0–100.0)
PLATELETS: 79 10*3/uL — AB (ref 150–400)
RBC: 3.56 MIL/uL — ABNORMAL LOW (ref 4.22–5.81)
RDW: 12.4 % (ref 11.5–15.5)
WBC: 4.1 10*3/uL (ref 4.0–10.5)

## 2016-05-28 LAB — LACTOFERRIN, FECAL, QUALITATIVE: Lactoferrin, Fecal, Qual: POSITIVE — AB

## 2016-05-28 MED ORDER — THIAMINE HCL 100 MG PO TABS
100.0000 mg | ORAL_TABLET | Freq: Every day | ORAL | 0 refills | Status: DC
Start: 2016-05-29 — End: 2019-11-07

## 2016-05-28 MED ORDER — PANTOPRAZOLE SODIUM 40 MG PO TBEC: 40.0000 mg | DELAYED_RELEASE_TABLET | Freq: Two times a day (BID) | ORAL | Status: DC

## 2016-05-28 MED ORDER — PANTOPRAZOLE SODIUM 40 MG PO TBEC: 40.0000 mg | DELAYED_RELEASE_TABLET | Freq: Every day | ORAL | 0 refills | Status: DC

## 2016-05-28 NOTE — Discharge Instructions (Signed)
Your doctor will need to check the following blood work next week: CBC  Please take all your medications with you for your next visit with your Primary MD. Please request your Primary MD to go over all hospital test results at the follow up. Please ask your Primary MD to get all Hospital records sent to his/her office.  If you experience worsening of your admission symptoms, develop shortness of breath, chest pain, suicidal or homicidal thoughts or a life threatening emergency, you must seek medical attention immediately by calling 911 or calling your MD.  Dennis Bast must read the complete instructions/literature along with all the possible adverse reactions/side effects for all the medicines you take including new medications that have been prescribed to you. Take new medicines after you have completely understood and accpet all the possible adverse reactions/side effects.   Do not drive when taking pain medications or sedatives.    Do not take more than prescribed Pain, Sleep and Anxiety Medications  If you have smoked or chewed Tobacco in the last 2 yrs please stop. Stop any regular alcohol and or recreational drug use.  Wear Seat belts while driving.

## 2016-05-28 NOTE — Consult Note (Signed)
   [facility] CM Inpatient Consult   05/28/2016  [name] 1955-12-11 [id number]    Came to visit Nathaniel Pena on behalf of [facility] for [name] with [employer]. Discussed the new [place] program DM patients. He denies having any [name] needs at this time. Confirmed best number at [phone] for post discharge call.   Will request for post discharge call. Nathaniel Pena reports he is discharging home today.    Nathaniel Rolling, MSN-Ed, RN,BSN [facility] Liaison [phone]

## 2016-05-28 NOTE — Care Management Note (Signed)
Case Management Note  Patient Details  Name: Nathaniel Pena MRN: [id number] Date of Birth: 1956/01/20  Subjective/Objective:   Presents with ETOH withdrawal, from home, referral given to [facility] for ETOH resources.  Patient for dc today after ambulate on unit to make sure he does not need HHPT.  NCM will cont to follow for dc needs.                  Action/Plan:   Expected Discharge Date:                  Expected Discharge Plan:  Home/Self Care  In-House Referral:     Discharge planning Services  CM Consult  Post Acute Care Choice:    Choice offered to:     DME Arranged:    DME Agency:     HH Arranged:    HH Agency:     Status of Service:  Completed, signed off  If discussed at [employer] of Stay Meetings, dates discussed:    Additional Comments:  Zenon Mayo, RN 05/28/2016, 10:50 AM

## 2016-05-28 NOTE — Discharge Summary (Signed)
Physician Discharge Summary  Nathaniel Pena [id number] DOB: July 19, 1955 DOA: 05/26/2016  PCP: Wyatt Haste, MD  Admit date: 05/26/2016 Discharge date: 05/28/2016  Admitted From: home Disposition:  home   Recommendations for Outpatient Follow-up:  1. CBC in 1 week- follow platelets  Discharge Condition:  stable   CODE STATUS:  Full code   Diet recommendation:  Heart heatlhy Consultations:      Discharge Diagnoses:  Principal Problem:   Intractable vomiting with nausea Active Problems:   Hypertension   Alcohol withdrawal (H[facility])   Abnormal transaminases   High anion gap metabolic acidosis   Diarrhea   Anxiety state   ETOH abuse   Thrombocytopenia ([facility])    Subjective: No complaints today. Eating solid food without GI symptoms.   Brief Summary: 61 y.o.malePMHx Depression, Seizures, EtOH abuse, HTN ,  PVC's, Eerectile dysfunction who presented to the ED after he sustained two unwitnessed falls at home thatoccurred 45 minutes apart. One occurred while sitting on the commode. He states his vision became shaky and he fell forward and hit his head. The second occurred due to rolling off the bed. He reported having nausea and vomiting for few days prior to these falls. He had also developed chest pain since which he believed could be actually indigestion. After being hospitalized, he developed diarrhea which resolved on its own. While in the hospital he went through ETOH withdrawal.   Hospital Course:    Epigastric pain, nausea, vomiting  -due to alcoholic gastritis, GERD- he admits to often having reflux of food in the middle of the night at home - Started PPI which he will continue for now- recommended to abstain from alcohol and BC powders- he takes a baby aspirin daily which I have not stopped  Diarrhea - occurred in the hospital and resolved 2 days ago  Falls -Had two unwitnessed falls and unclear whether or not he had seizure -EEG and CT head normal  ETOH  abuse/ withdrawal and cirrhosis - underwent mild ETOH withdrawl - he would like to stop drinking completely as long as he can get his anxiety issues to resolve - imaging of liver shows "medical liver disease"- see report below- hepatitis panel negative   Thrombocytopenia - due to ETOH?- recommend repeat CBC in 1 wk by PCP  Anxiety - he states this is the reason he drinks alcohol - discussed the possible need for SSRIs- he wants to discussed this further with his PCP next week   HTN - cont home meds  Lactic Acidosis with elevated anion GAP -Most likely associated with alcohol abuse- resolved  Marijuana abuse - noted to be + on UDS     Discharge Instructions  Discharge Instructions    Diet - low sodium heart healthy    Complete by:  As directed    Increase activity slowly    Complete by:  As directed      Allergies as of 05/28/2016   No Known Allergies     Medication List    STOP taking these medications   BC HEADACHE POWDER PO     TAKE these medications   aspirin 81 MG chewable tablet Chew 81 mg by mouth every 6 (six) hours as needed for mild pain.   lisinopril-hydrochlorothiazide 20-12.5 MG tablet Commonly known as:  PRINZIDE,ZESTORETIC Take 1 tablet by mouth daily.   pantoprazole 40 MG tablet Commonly known as:  PROTONIX Take 1 tablet (40 mg total) by mouth daily.   tadalafil 20 MG tablet Commonly known as:  CIALIS Take 1 tablet (20 mg total) by mouth daily as needed for erectile dysfunction.   thiamine 100 MG tablet Take 1 tablet (100 mg total) by mouth daily. Start taking on:  05/29/2016      Follow-up [place], MD Follow up.   Specialty:  Family Medicine Why:  in 1 week  Contact information: [address] [place] [address] [phone]          No Known Allergies   Procedures/Studies:    Ct Head Wo Contrast  Result Date: 05/26/2016 CLINICAL DATA:  Persistent nausea for couple weeks. EXAM:  CT HEAD WITHOUT CONTRAST TECHNIQUE: Contiguous axial images were obtained from the base of the skull through the vertex without intravenous contrast. COMPARISON:  None. FINDINGS: Brain: No evidence of acute infarction, hemorrhage, extra-axial collection, ventriculomegaly, or mass effect. There is a left posterior fossae are arachnoid cyst versus prominent asymmetric cisterna magna. Generalized cerebral atrophy. Periventricular white matter low attenuation likely secondary to microangiopathy. Vascular: Cerebrovascular atherosclerotic calcifications are noted. Skull: Negative for fracture or focal lesion. Sinuses/Orbits: Visualized portions of the orbits are unremarkable. The mastoid sinuses are clear. Small right maxillary sinus mucous retention cyst versus polyp. Other: None. IMPRESSION: 1. No acute intracranial pathology. 2. Chronic microvascular disease and cerebral atrophy. Electronically Signed   By: Kathreen Devoid   On: 05/26/2016 10:05   Dg Chest Port 1 View  Result Date: 05/26/2016 CLINICAL DATA:  Syncope, unwitnessed falls EXAM: PORTABLE CHEST 1 VIEW COMPARISON:  None. FINDINGS: The heart size and mediastinal contours are within normal limits. Both lungs are clear. The visualized skeletal structures are unremarkable. IMPRESSION: No active disease. Electronically Signed   By: Kathreen Devoid   On: 05/26/2016 13:42   US Abdomen Limited Ruq  Result Date: 05/26/2016 CLINICAL DATA:  61 year old male with transaminitis EXAM: US ABDOMEN LIMITED - RIGHT UPPER QUADRANT COMPARISON:  None. FINDINGS: Gallbladder: No gallstones or wall thickening visualized. No sonographic Murphy sign noted by sonographer. Common bile duct: Diameter: 6 mm Liver: No focal lesion.  Heterogeneous echotexture of liver parenchyma. IMPRESSION: Sonographic survey demonstrates no evidence of cholelithiasis. Heterogeneous echotexture of liver parenchyma, potentially representing medical liver disease. Signed, Dulcy Fanny. Earleen Newport, DO [facility] Electronically Signed   By: Corrie Mckusick D.O.   On: 05/26/2016 15:06       Discharge Exam: Vitals:   05/28/16 0346 05/28/16 0756  BP: 122/80 136/89  Pulse: 70 65  Resp: 14 16  Temp: 98.9 F (37.2 C) 97.5 F (36.4 C)   Vitals:   05/27/16 2004 05/27/16 2300 05/28/16 0346 05/28/16 0756  BP: 127/88 (!) 135/96 122/80 136/89  Pulse: 96 93 70 65  Resp: 20 14 14 16   Temp: 99 F (37.2 C) 98.9 F (37.2 C) 98.9 F (37.2 C) 97.5 F (36.4 C)  TempSrc: Oral Oral Oral Oral  SpO2: 96% 92% 99% 97%  Weight:      Height:        General: Pt is alert, awake, not in acute distress Cardiovascular: RRR, S1/S2 +, no rubs, no gallops Respiratory: CTA bilaterally, no wheezing, no rhonchi Abdominal: Soft, NT, ND, bowel sounds + Extremities: no edema, no cyanosis    The results of significant diagnostics from this hospitalization (including imaging, microbiology, ancillary and laboratory) are listed below for reference.     Microbiology: Recent Results (from the past 240 hour(s))  MRSA PCR Screening     Status: None   Collection Time:  05/27/16  4:00 AM  Result Value Ref Range Status   MRSA by PCR NEGATIVE NEGATIVE Final    Comment:        The GeneXpert MRSA Assay (FDA approved for NASAL specimens only), is one component of a comprehensive MRSA colonization surveillance program. It is not intended to diagnose MRSA infection nor to guide or monitor treatment for MRSA infections.      Labs: BNP (last 3 results) No results for input(s): BNP in the last 8760 hours. Basic Metabolic Panel:  Recent Labs Lab 05/26/16 0915 05/27/16 0104 05/28/16 0823  NA 136 134*  135 138  K 4.1 3.4*  3.5 3.5  CL 95* 98*  99* 107  CO2 19* 27  25 27   GLUCOSE 107* 114*  116* 95  BUN 17 17  19 10   CREATININE 0.98 0.74  0.79 0.70  CALCIUM 10.2 9.0  9.1 8.4*   Liver Function Tests:  Recent Labs Lab 05/26/16 0915 05/27/16 0104  05/28/16 0823  AST 66* 46* 97*  ALT 70* 53 73*  ALKPHOS 62 49 43  BILITOT 2.2* 1.8* 1.1  PROT 6.7 5.5* 4.8*  ALBUMIN 4.3 3.5 2.9*    Recent Labs Lab 05/27/16 0104  LIPASE 25   No results for input(s): AMMONIA in the last 168 hours. CBC:  Recent Labs Lab 05/26/16 0915 05/27/16 0104 05/28/16 0823  WBC 16.8* 8.1 4.1  NEUTROABS 15.1*  --   --   HGB 15.2 13.8 12.2*  HCT 43.8 39.0 34.7*  MCV 98.0 95.8 97.5  PLT 141* 106* 79*   Cardiac Enzymes:  Recent Labs Lab 05/27/16 0104  CKTOTAL 61   BNP: Invalid input(s): POCBNP CBG: No results for input(s): GLUCAP in the last 168 hours. D-Dimer No results for input(s): DDIMER in the last 72 hours. Hgb A1c No results for input(s): HGBA1C in the last 72 hours. Lipid Profile No results for input(s): CHOL, HDL, LDLCALC, TRIG, CHOLHDL, LDLDIRECT in the last 72 hours. Thyroid function studies No results for input(s): TSH, T4TOTAL, T3FREE, THYROIDAB in the last 72 hours.  Invalid input(s): FREET3 Anemia work up No results for input(s): VITAMINB12, FOLATE, FERRITIN, TIBC, IRON, RETICCTPCT in the last 72 hours. Urinalysis    Component Value Date/Time   BILIRUBINUR n 03/26/2012 1220   PROTEINUR n 03/26/2012 1220   UROBILINOGEN negative 03/26/2012 1220   NITRITE n 03/26/2012 1220   LEUKOCYTESUR Negative 03/26/2012 1220   Sepsis Labs Invalid input(s): PROCALCITONIN,  WBC,  LACTICIDVEN Microbiology Recent Results (from the past 240 hour(s))  MRSA PCR Screening     Status: None   Collection Time: 05/27/16  4:00 AM  Result Value Ref Range Status   MRSA by PCR NEGATIVE NEGATIVE Final    Comment:        The GeneXpert MRSA Assay (FDA approved for NASAL specimens only), is one component of a comprehensive MRSA colonization surveillance program. It is not intended to diagnose MRSA infection nor to guide or monitor treatment for MRSA infections.      Time coordinating discharge: Over 30  minutes  SIGNED:   Debbe Odea, MD  [facility] 05/28/2016, 12:52 PM Pager   If 7PM-7AM, please contact night-coverage [email or web address] Password [email or web address]

## 2016-05-28 NOTE — Progress Notes (Signed)
Patient given discharged instructions. No concerns voiced at discharge.  IV removed and patient discharged via wheel chair.

## 2016-05-29 ENCOUNTER — Telehealth: Payer: Self-pay | Admitting: Family Medicine

## 2016-05-29 NOTE — Telephone Encounter (Signed)
Call pt and spoke to him concerning recent hospital stay. Pt medications gone over and pt states new meds given by hospital will be picked up today.

## 2016-06-03 ENCOUNTER — Encounter: Payer: Self-pay | Admitting: Family Medicine

## 2016-06-03 ENCOUNTER — Ambulatory Visit (INDEPENDENT_AMBULATORY_CARE_PROVIDER_SITE_OTHER): Payer: Commercial Managed Care - HMO | Admitting: Family Medicine

## 2016-06-03 VITALS — BP 130/60 | HR 44 | Wt 194.0 lb

## 2016-06-03 DIAGNOSIS — F1010 Alcohol abuse, uncomplicated: Secondary | ICD-10-CM

## 2016-06-03 DIAGNOSIS — D696 Thrombocytopenia, unspecified: Secondary | ICD-10-CM

## 2016-06-03 DIAGNOSIS — F32A Depression, unspecified: Secondary | ICD-10-CM

## 2016-06-03 DIAGNOSIS — F329 Major depressive disorder, single episode, unspecified: Secondary | ICD-10-CM

## 2016-06-03 DIAGNOSIS — Z630 Problems in relationship with spouse or partner: Secondary | ICD-10-CM

## 2016-06-03 DIAGNOSIS — F10939 Alcohol use, unspecified with withdrawal, unspecified: Secondary | ICD-10-CM

## 2016-06-03 DIAGNOSIS — F10239 Alcohol dependence with withdrawal, unspecified: Secondary | ICD-10-CM | POA: Diagnosis not present

## 2016-06-03 DIAGNOSIS — I10 Essential (primary) hypertension: Secondary | ICD-10-CM | POA: Diagnosis not present

## 2016-06-03 LAB — CBC WITH DIFFERENTIAL/PLATELET
BASOS ABS: 64 {cells}/uL (ref 0–200)
Basophils Relative: 1 %
EOS ABS: 64 {cells}/uL (ref 15–500)
Eosinophils Relative: 1 %
HEMATOCRIT: 44.2 % (ref 38.5–50.0)
Hemoglobin: 15.3 g/dL (ref 13.2–17.1)
LYMPHS PCT: 22 %
Lymphs Abs: 1408 cells/uL (ref 850–3900)
MCH: 33.8 pg — AB (ref 27.0–33.0)
MCHC: 34.6 g/dL (ref 32.0–36.0)
MCV: 97.8 fL (ref 80.0–100.0)
MONOS PCT: 16 %
MPV: 9.1 fL (ref 7.5–12.5)
Monocytes Absolute: 1024 cells/uL — ABNORMAL HIGH (ref 200–950)
NEUTROS ABS: 3840 {cells}/uL (ref 1500–7800)
Neutrophils Relative %: 60 %
PLATELETS: 264 10*3/uL (ref 140–400)
RBC: 4.52 MIL/uL (ref 4.20–5.80)
RDW: 13.4 % (ref 11.0–15.0)
WBC: 6.4 10*3/uL (ref 4.0–10.5)

## 2016-06-03 LAB — COMPREHENSIVE METABOLIC PANEL
ALBUMIN: 4.3 g/dL (ref 3.6–5.1)
ALK PHOS: 55 U/L (ref 40–115)
ALT: 135 U/L — AB (ref 9–46)
AST: 75 U/L — ABNORMAL HIGH (ref 10–35)
BUN: 9 mg/dL (ref 7–25)
CALCIUM: 9.3 mg/dL (ref 8.6–10.3)
CO2: 28 mmol/L (ref 20–31)
Chloride: 98 mmol/L (ref 98–110)
Creat: 0.66 mg/dL — ABNORMAL LOW (ref 0.70–1.25)
Glucose, Bld: 82 mg/dL (ref 65–99)
POTASSIUM: 3.5 mmol/L (ref 3.5–5.3)
Sodium: 140 mmol/L (ref 135–146)
Total Bilirubin: 0.9 mg/dL (ref 0.2–1.2)
Total Protein: 6.6 g/dL (ref 6.1–8.1)

## 2016-06-03 NOTE — Patient Instructions (Signed)
Go by [facility] and let me know what they say

## 2016-06-03 NOTE — Progress Notes (Signed)
   Subjective:    Patient ID: Nathaniel Pena, male    DOB: July 09, 1955, 61 y.o.   MRN: [id number]  HPI He is here for a hospital follow-up. He was admitted on January 8 and sent home on the 10th. He was treated for alcohol related problems of nausea, vomiting, gastritis. He was also noted to possibly be in alcohol withdrawal. He admits to having difficulty with anxiety and self-medicating with multiple medications including alcohol, Xanax and THC. He is having some marital distress and apparently his wife is considering leaving him. He is quite despondent over this.   Review of Systems     Objective:   Physical Exam Alert and in no distress and tearful. Dressed appropriately. He offers no suicidal ideation. The hospital discharge summary and labs were reviewed. Multiple abnormalities were seen. He apparently also had a CT scan and EEG which was negative.      Assessment & Plan:  Alcohol abuse - Plan: CBC with Differential/Platelet, Comprehensive metabolic panel  Essential hypertension - Plan: CBC with Differential/Platelet, Comprehensive metabolic panel  Depression, unspecified depression type  Alcohol withdrawal syndrome with complication ([place]) - Plan: CBC with Differential/Platelet, Comprehensive metabolic panel  Thrombocytopenia (HCC) - Plan: CBC with Differential/Platelet  Marital stress I discussed all the issues he had with the admission and how he is handling this. Encouraged him to get involved in counseling and potentially get on medication. I explained the fact that I did not feel comfortable giving him any antianxiety or psychotropic medications based on his history. He will go to [employer] on his way home and discuss getting involved in their programs. He is to let me know on My Chart how [facility] will help him. Over 25 minutes, the entire time spent in counseling and coordination of care.

## 2016-06-13 DIAGNOSIS — F322 Major depressive disorder, single episode, severe without psychotic features: Secondary | ICD-10-CM | POA: Diagnosis not present

## 2016-06-13 DIAGNOSIS — F411 Generalized anxiety disorder: Secondary | ICD-10-CM | POA: Diagnosis not present

## 2016-06-13 DIAGNOSIS — F341 Dysthymic disorder: Secondary | ICD-10-CM | POA: Diagnosis not present

## 2016-07-11 DIAGNOSIS — F410 Panic disorder [episodic paroxysmal anxiety] without agoraphobia: Secondary | ICD-10-CM | POA: Diagnosis not present

## 2016-07-11 DIAGNOSIS — F411 Generalized anxiety disorder: Secondary | ICD-10-CM | POA: Diagnosis not present

## 2016-07-11 DIAGNOSIS — F341 Dysthymic disorder: Secondary | ICD-10-CM | POA: Diagnosis not present

## 2016-07-14 DIAGNOSIS — F411 Generalized anxiety disorder: Secondary | ICD-10-CM | POA: Diagnosis not present

## 2016-08-04 DIAGNOSIS — F411 Generalized anxiety disorder: Secondary | ICD-10-CM | POA: Diagnosis not present

## 2016-08-07 DIAGNOSIS — H5203 Hypermetropia, bilateral: Secondary | ICD-10-CM | POA: Diagnosis not present

## 2016-08-08 DIAGNOSIS — F411 Generalized anxiety disorder: Secondary | ICD-10-CM | POA: Diagnosis not present

## 2016-08-25 DIAGNOSIS — F411 Generalized anxiety disorder: Secondary | ICD-10-CM | POA: Diagnosis not present

## 2016-09-05 DIAGNOSIS — F411 Generalized anxiety disorder: Secondary | ICD-10-CM | POA: Diagnosis not present

## 2016-09-15 DIAGNOSIS — F411 Generalized anxiety disorder: Secondary | ICD-10-CM | POA: Diagnosis not present

## 2016-09-29 ENCOUNTER — Telehealth: Payer: Self-pay

## 2016-09-29 ENCOUNTER — Other Ambulatory Visit: Payer: Self-pay

## 2016-09-29 MED ORDER — PANTOPRAZOLE SODIUM 40 MG PO TBEC: 40.0000 mg | DELAYED_RELEASE_TABLET | Freq: Every day | ORAL | 0 refills | Status: DC

## 2016-09-29 NOTE — Telephone Encounter (Signed)
Refill request rcvd from [name] for pantoprazole. Nathaniel Pena

## 2016-09-29 NOTE — Telephone Encounter (Signed)
Pantoprazole sent in

## 2016-10-14 DIAGNOSIS — F411 Generalized anxiety disorder: Secondary | ICD-10-CM | POA: Diagnosis not present

## 2016-11-20 DIAGNOSIS — F411 Generalized anxiety disorder: Secondary | ICD-10-CM | POA: Diagnosis not present

## 2016-11-28 DIAGNOSIS — F411 Generalized anxiety disorder: Secondary | ICD-10-CM | POA: Diagnosis not present

## 2016-12-05 ENCOUNTER — Other Ambulatory Visit: Payer: Self-pay | Admitting: Family Medicine

## 2016-12-05 DIAGNOSIS — I10 Essential (primary) hypertension: Secondary | ICD-10-CM

## 2016-12-05 NOTE — Telephone Encounter (Signed)
Is this okay to refill? 

## 2016-12-25 DIAGNOSIS — F411 Generalized anxiety disorder: Secondary | ICD-10-CM | POA: Diagnosis not present

## 2017-01-22 DIAGNOSIS — F411 Generalized anxiety disorder: Secondary | ICD-10-CM | POA: Diagnosis not present

## 2017-04-06 ENCOUNTER — Ambulatory Visit (INDEPENDENT_AMBULATORY_CARE_PROVIDER_SITE_OTHER): Payer: 59 | Admitting: Family Medicine

## 2017-04-06 ENCOUNTER — Encounter: Payer: Self-pay | Admitting: Family Medicine

## 2017-04-06 VITALS — BP 120/80 | HR 64 | Resp 16 | Wt 220.8 lb

## 2017-04-06 DIAGNOSIS — B078 Other viral warts: Secondary | ICD-10-CM | POA: Diagnosis not present

## 2017-04-06 DIAGNOSIS — Z23 Encounter for immunization: Secondary | ICD-10-CM | POA: Diagnosis not present

## 2017-04-06 NOTE — Progress Notes (Signed)
   Subjective:    Patient ID: Nathaniel Pena, male    DOB: 04/24/1956, 61 y.o.   MRN: [id number]  HPI He is here for evaluation of a lesion on his back that has been giving him some difficulty.   Review of Systems     Objective:   Physical Exam Alert and in no distress.  A 1 cm raised filiform type lesion is noted on his back       Assessment & Plan:  Other viral warts  Need for shingles vaccine - Plan: Varicella-zoster vaccine IM (Shingrix)  He definitely has a wart on his back and would like it removed.  It was injected with Xylocaine and hyfrecated without difficulty. He will also be given a Shingrix vaccine.

## 2017-04-17 DIAGNOSIS — F411 Generalized anxiety disorder: Secondary | ICD-10-CM | POA: Diagnosis not present

## 2017-06-08 ENCOUNTER — Other Ambulatory Visit: Payer: 59

## 2017-06-10 ENCOUNTER — Other Ambulatory Visit (INDEPENDENT_AMBULATORY_CARE_PROVIDER_SITE_OTHER): Payer: 59

## 2017-06-10 DIAGNOSIS — Z23 Encounter for immunization: Secondary | ICD-10-CM

## 2017-07-23 ENCOUNTER — Other Ambulatory Visit: Payer: Self-pay | Admitting: Family Medicine

## 2017-07-23 NOTE — Telephone Encounter (Signed)
Left message for pt to call back and schedule an appt. Once appt is scheduled we will refill med for 30 days

## 2017-08-07 ENCOUNTER — Other Ambulatory Visit: Payer: Self-pay | Admitting: Family Medicine

## 2017-08-07 NOTE — Telephone Encounter (Signed)
Is this okay to refill? Pt was seen for an acute visit recently no other appt since beginning of 2018

## 2017-10-15 DIAGNOSIS — F411 Generalized anxiety disorder: Secondary | ICD-10-CM | POA: Diagnosis not present

## 2017-11-03 ENCOUNTER — Other Ambulatory Visit: Payer: Self-pay | Admitting: Family Medicine

## 2017-11-03 DIAGNOSIS — I10 Essential (primary) hypertension: Secondary | ICD-10-CM

## 2017-12-01 IMAGING — US US ABDOMEN LIMITED
1 series · 14 of 25 positions shown · non-contrast
Comparison: None.

CLINICAL DATA: 60-year-old male with transaminitis

EXAM:
US ABDOMEN LIMITED - RIGHT UPPER QUADRANT

[Series 1: us abdomen limited · 0.23mm/px · 14 of 42 slices shown]
[im 1/42]
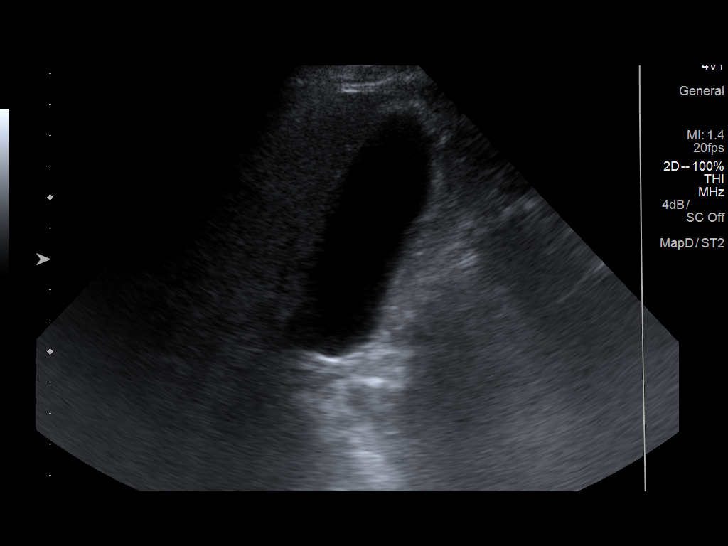
[im 4/42]
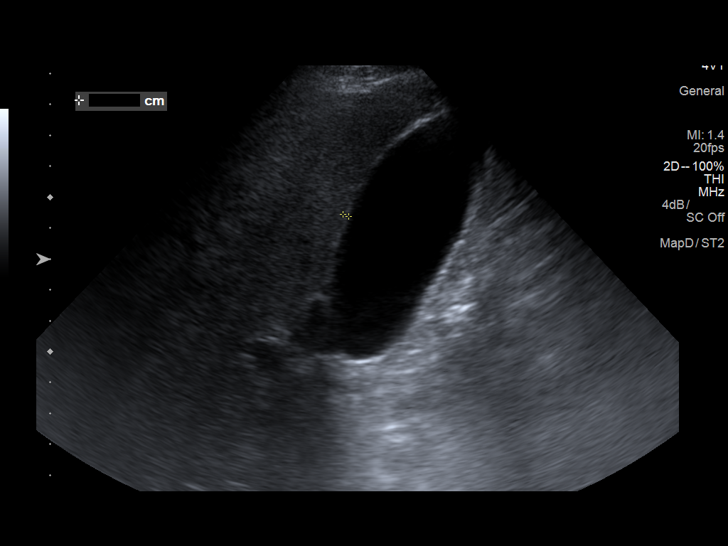
[im 7/42]
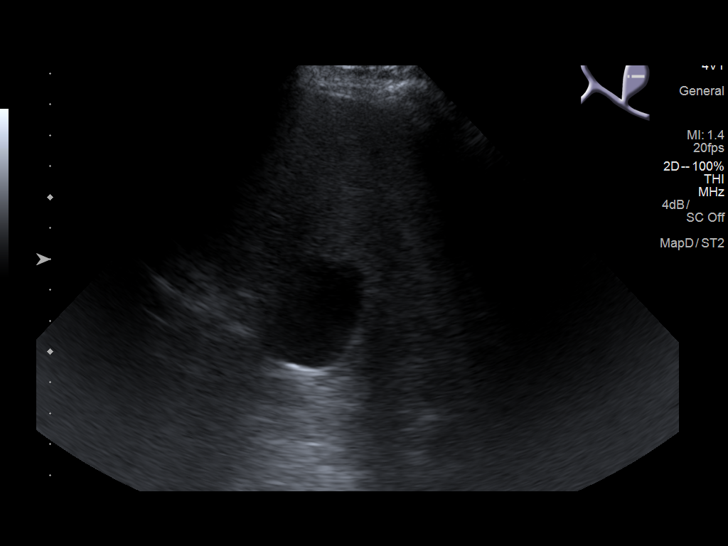
[im 11/42]
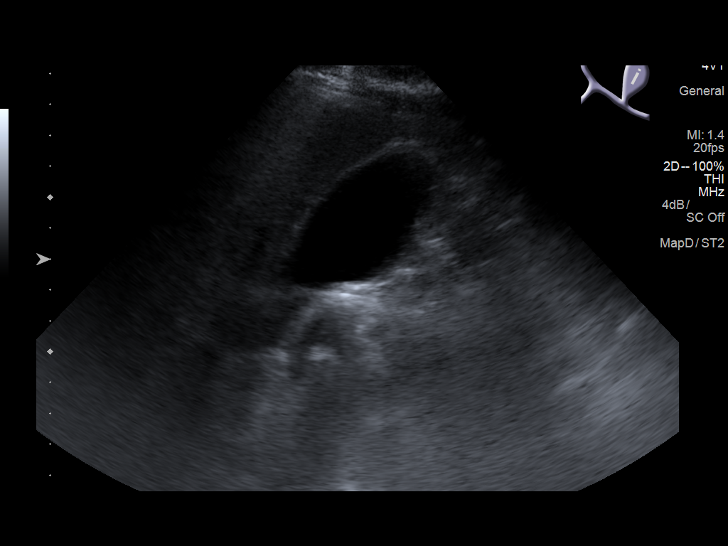
[im 14/42]
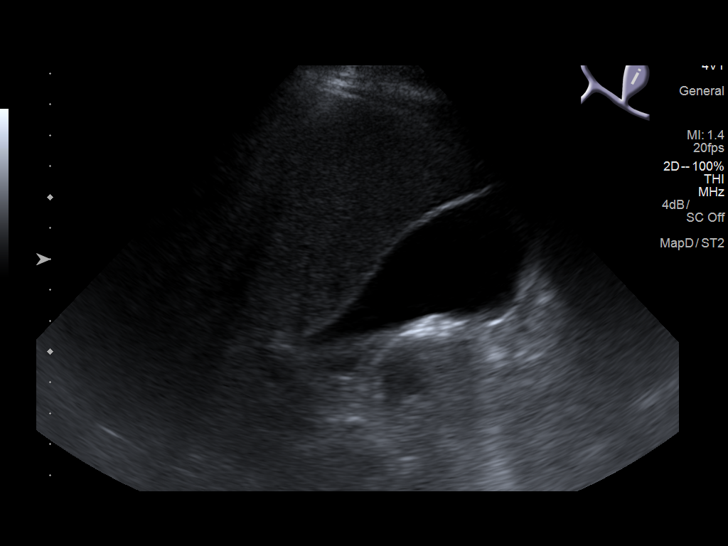
[im 16/42]
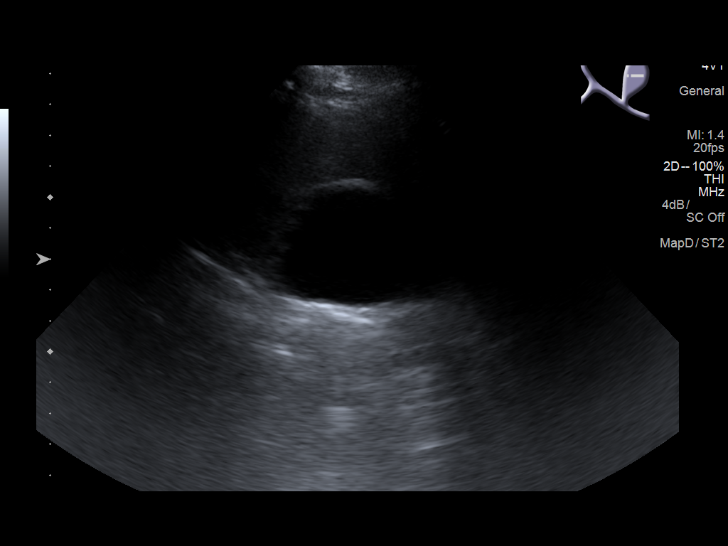
[im 19/42]
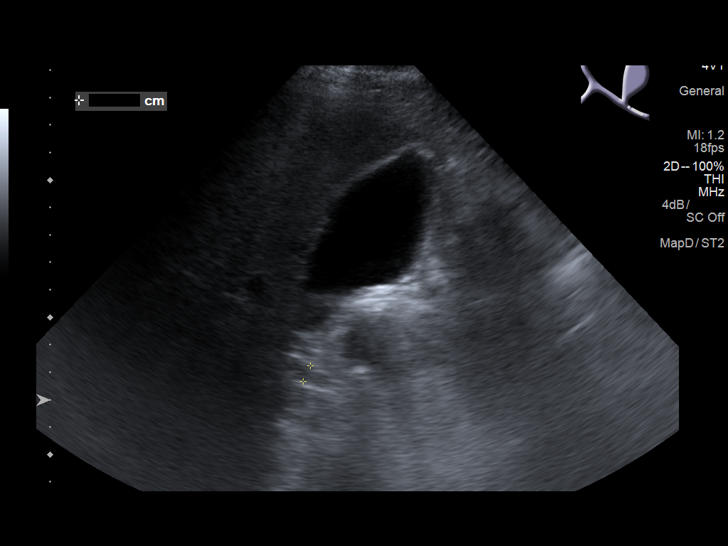
[im 23/42]
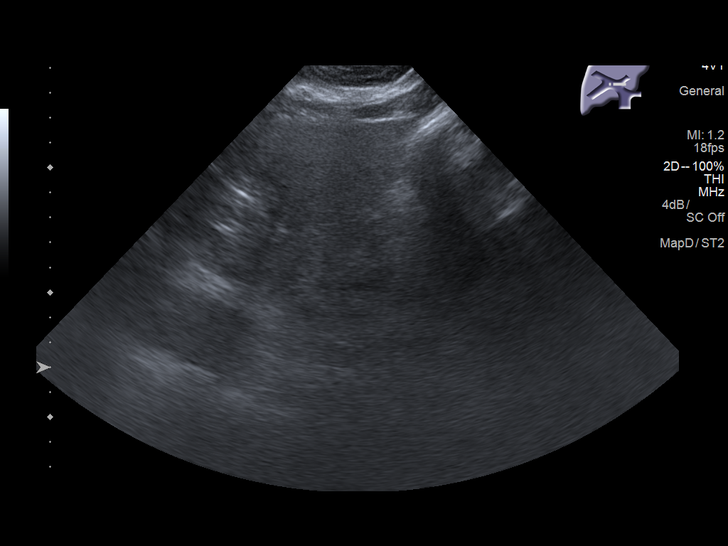
[im 26/42]
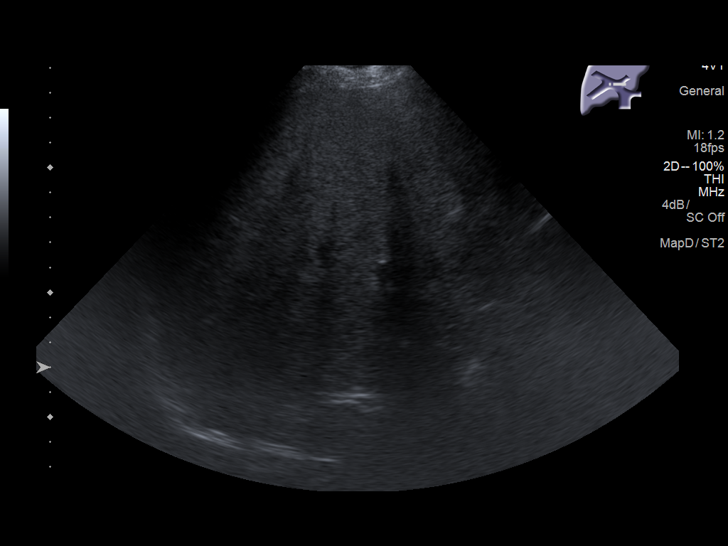
[im 28/42]
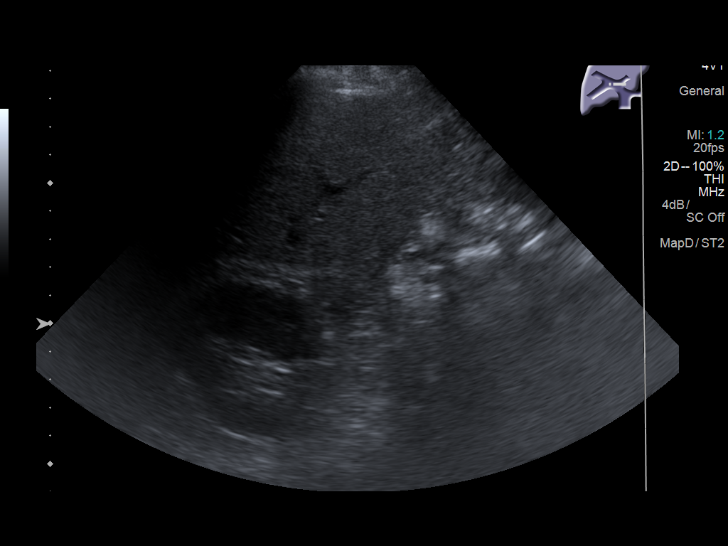
[im 31/42]
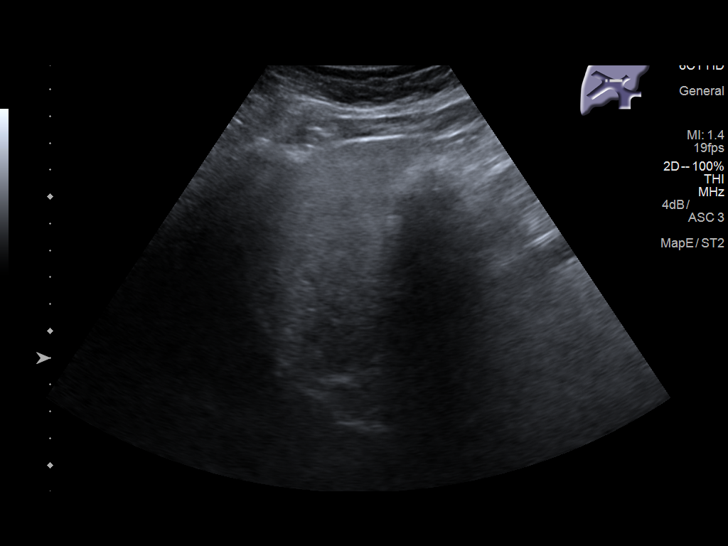
[im 35/42]
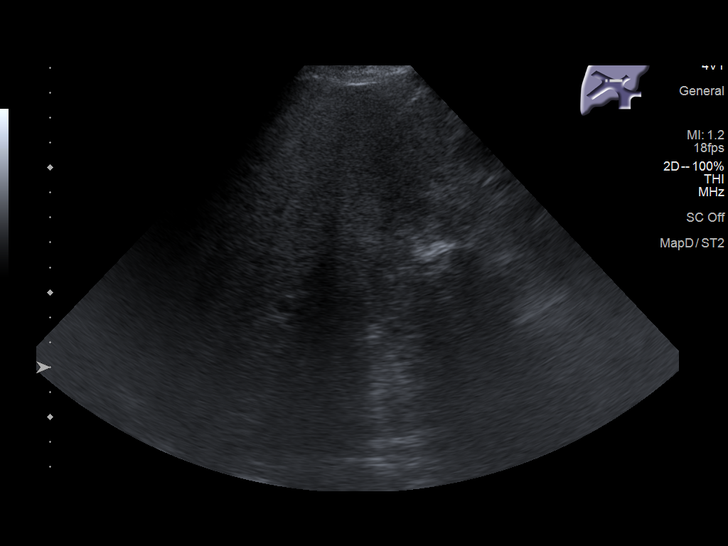
[im 38/42]
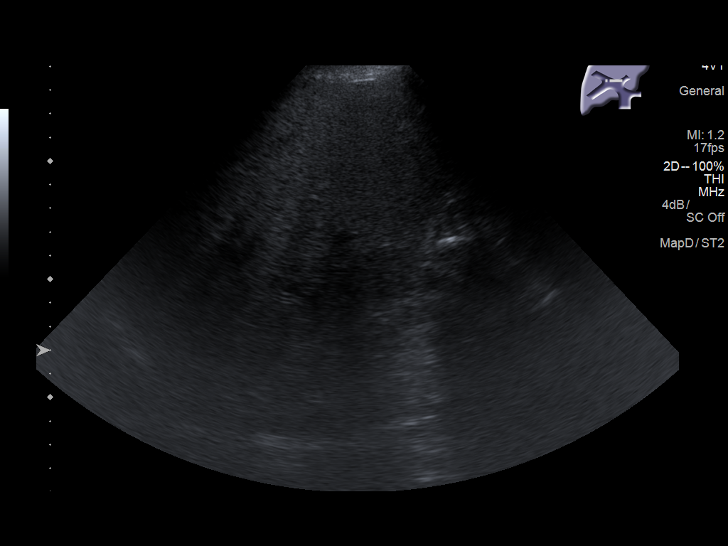
[im 42/42]
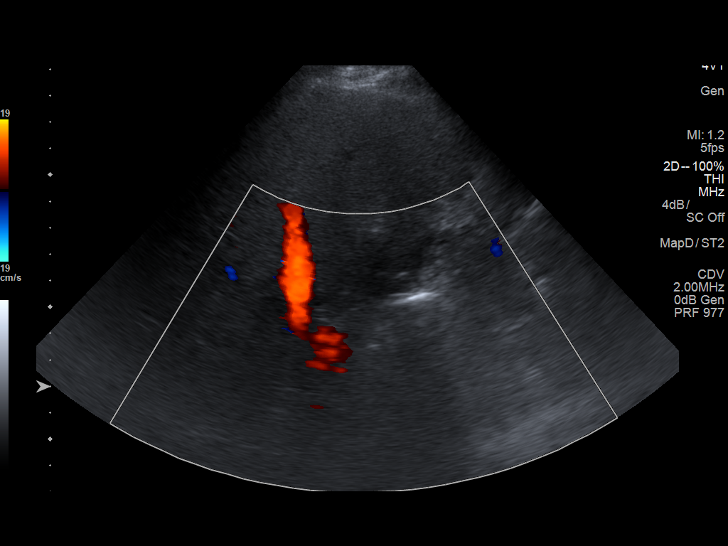

[14 of 25 positions shown; findings below may reference images not displayed]

FINDINGS: Gallbladder:

No gallstones or wall thickening visualized. No sonographic Murphy
sign noted by sonographer.

Common bile duct:

Diameter: 6 mm

Liver:

No focal lesion.  Heterogeneous echotexture of liver parenchyma.
IMPRESSION: Sonographic survey demonstrates no evidence of cholelithiasis.

Heterogeneous echotexture of liver parenchyma, potentially
representing medical liver disease.

## 2017-12-15 ENCOUNTER — Other Ambulatory Visit: Payer: Self-pay

## 2017-12-15 ENCOUNTER — Telehealth: Payer: Self-pay | Admitting: Family Medicine

## 2017-12-15 MED ORDER — PANTOPRAZOLE SODIUM 40 MG PO TBEC: 40.0000 mg | DELAYED_RELEASE_TABLET | Freq: Every day | ORAL | 1 refills | Status: DC

## 2017-12-15 NOTE — Telephone Encounter (Signed)
Pt called and he has a cpe schedule for late August. Needs refills on Protonix until then. Pt uses [facility] and can be reached at [phone].

## 2017-12-15 NOTE — Telephone Encounter (Signed)
Done KH 

## 2018-01-12 ENCOUNTER — Ambulatory Visit (INDEPENDENT_AMBULATORY_CARE_PROVIDER_SITE_OTHER): Payer: 59 | Admitting: Family Medicine

## 2018-01-12 ENCOUNTER — Encounter: Payer: Self-pay | Admitting: Family Medicine

## 2018-01-12 VITALS — BP 138/80 | HR 54 | Temp 97.9°F | Wt 222.8 lb

## 2018-01-12 DIAGNOSIS — F411 Generalized anxiety disorder: Secondary | ICD-10-CM

## 2018-01-12 DIAGNOSIS — Z6379 Other stressful life events affecting family and household: Secondary | ICD-10-CM | POA: Diagnosis not present

## 2018-01-12 DIAGNOSIS — F1010 Alcohol abuse, uncomplicated: Secondary | ICD-10-CM

## 2018-01-12 DIAGNOSIS — Z0000 Encounter for general adult medical examination without abnormal findings: Secondary | ICD-10-CM | POA: Diagnosis not present

## 2018-01-12 DIAGNOSIS — Z23 Encounter for immunization: Secondary | ICD-10-CM

## 2018-01-12 DIAGNOSIS — D696 Thrombocytopenia, unspecified: Secondary | ICD-10-CM | POA: Diagnosis not present

## 2018-01-12 DIAGNOSIS — F17200 Nicotine dependence, unspecified, uncomplicated: Secondary | ICD-10-CM

## 2018-01-12 DIAGNOSIS — I10 Essential (primary) hypertension: Secondary | ICD-10-CM

## 2018-01-12 DIAGNOSIS — N529 Male erectile dysfunction, unspecified: Secondary | ICD-10-CM

## 2018-01-12 DIAGNOSIS — K219 Gastro-esophageal reflux disease without esophagitis: Secondary | ICD-10-CM

## 2018-01-12 LAB — POCT URINALYSIS DIP (PROADVANTAGE DEVICE)
Bilirubin, UA: NEGATIVE
GLUCOSE UA: NEGATIVE mg/dL
Ketones, POC UA: NEGATIVE mg/dL
Leukocytes, UA: NEGATIVE
Nitrite, UA: NEGATIVE
Protein Ur, POC: NEGATIVE mg/dL
RBC UA: NEGATIVE
Specific Gravity, Urine: 1.015
UUROB: 3.5
pH, UA: 6 (ref 5.0–8.0)

## 2018-01-12 MED ORDER — TADALAFIL 20 MG PO TABS: 20.0000 mg | ORAL_TABLET | Freq: Every day | ORAL | 5 refills | Status: DC | PRN

## 2018-01-12 MED ORDER — LISINOPRIL-HYDROCHLOROTHIAZIDE 20-12.5 MG PO TABS: 1.0000 | ORAL_TABLET | Freq: Every day | ORAL | 3 refills | Status: DC

## 2018-01-12 MED ORDER — PANTOPRAZOLE SODIUM 40 MG PO TBEC: 40.0000 mg | DELAYED_RELEASE_TABLET | Freq: Every day | ORAL | 1 refills | Status: DC

## 2018-01-12 NOTE — Addendum Note (Signed)
Addended by: Elyse Jarvis on: 01/12/2018 12:14 PM   Modules accepted: Orders

## 2018-01-12 NOTE — Progress Notes (Signed)
Subjective:    Patient ID: Nathaniel Pena, male    DOB: [birth year], 62 y.o.   MRN: [id number]  HPI He is here for complete examination.  He does have a previous history of admission for nausea and vomiting and alcohol withdrawal.  He does continue to drink roughly 6 beers per day.  The work-up during that time did show evidence of thrombocytopenia as well as some liver enzyme abnormalities.  He also continues to smoke.  He has no interest in quitting smoking.  Continues on his Protonix for treatment of his underlying reflux on a daily basis.  He is on Lexapro, Atarax and Abilify given him by his psychiatrist.  Plans to follow-up with them in roughly 3 to 4 months.  He would like a refill on his Cialis.  He has had some difficulty recently dealing with his youngest son and some problems with drugs and with the girlfriend who apparently overdosed on heroin.  And social history as well as health maintenance and immunizations was reviewed.   Review of Systems  All other systems reviewed and are negative.      Objective:   Physical Exam BP 138/80 (BP Location: Left Arm, Patient Position: Sitting)   Pulse (!) 54   Temp 97.9 F (36.6 C)   Wt 222 lb 12.8 oz (101.1 kg)   SpO2 99%   BMI 27.12 kg/m   General Appearance:    Alert, cooperative, no distress, appears stated age  Head:    Normocephalic, without obvious abnormality, atraumatic  Eyes:    PERRL, conjunctiva/corneas clear, EOM's intact, fundi    benign  Ears:    Normal TM's and external ear canals  Nose:   Nares normal, mucosa normal, no drainage or sinus   tenderness  Throat:   Lips, mucosa, and tongue normal; teeth and gums normal  Neck:   Supple, no lymphadenopathy;  thyroid:  no   enlargement/tenderness/nodules; no carotid   bruit or JVD     Lungs:     Clear to auscultation bilaterally without wheezes, rales or     ronchi; respirations unlabored  Chest Wall:    No tenderness or deformity   Heart:    Regular rate and rhythm,  S1 and S2 normal, no murmur, rub   or gallop     Abdomen:     Soft, non-tender, nondistended, normoactive bowel sounds,    no masses, no hepatosplenomegaly  Genitalia:   Deferred     Extremities:   No clubbing, cyanosis or edema  Pulses:   2+ and symmetric all extremities  Skin:   Skin color, texture, turgor normal, no rashes or lesions  Lymph nodes:   Cervical, supraclavicular, and axillary nodes normal  Neurologic:   CNII-XII intact, normal strength, sensation and gait; reflexes 2+ and symmetric throughout          Psych:   Normal mood, affect, hygiene and grooming.          Assessment & Plan:  Routine general medical examination at a health care facility - Plan: CBC with Differential/Platelet, Comprehensive metabolic panel, Lipid panel  Need for influenza vaccination - Plan: Flu Vaccine QUAD 6+ mos PF IM (Fluarix Quad PF)  Anxiety state  Essential hypertension - Plan: CBC with Differential/Platelet, Comprehensive metabolic panel, lisinopril-hydrochlorothiazide (PRINZIDE,ZESTORETIC) 20-12.5 MG tablet  Erectile dysfunction, unspecified erectile dysfunction type - Plan: tadalafil (CIALIS) 20 MG tablet  ETOH abuse - Plan: CBC with Differential/Platelet, Comprehensive metabolic panel  Other stressful life events affecting family  and household  Thrombocytopenia ([place]) - Plan: CBC with Differential/Platelet  Gastroesophageal reflux disease without esophagitis - Plan: pantoprazole (PROTONIX) 40 MG tablet I discussed smoking cessation with him however at this time he is not interested in quitting.  Also strongly encouraged him to cut out all alcohol but would accept him reducing his intake to 1 or 2 beverages per night.  I will renew his Cialis at his request.  His other medications were renewed.  Discussed the problem he is having with his youngest son and encouraged him to be careful not enable his son by doing too much for him.  Discussed possibly stopping the antidepressants but then  mentioned to him that he is using a depressant in the form of alcohol as well as his other medications and explained that this is counterproductive. Also discussed cutting back on his Protonix and using it more less on an as-needed basis.

## 2018-01-13 LAB — CBC WITH DIFFERENTIAL/PLATELET
Basophils Absolute: 0 10*3/uL (ref 0.0–0.2)
Basos: 1 %
EOS (ABSOLUTE): 0.1 10*3/uL (ref 0.0–0.4)
EOS: 2 %
HEMATOCRIT: 43.5 % (ref 37.5–51.0)
Hemoglobin: 14.9 g/dL (ref 13.0–17.7)
IMMATURE GRANULOCYTES: 0 %
Immature Grans (Abs): 0 10*3/uL (ref 0.0–0.1)
LYMPHS: 31 %
Lymphocytes Absolute: 1.7 10*3/uL (ref 0.7–3.1)
MCH: 30.9 pg (ref 26.6–33.0)
MCHC: 34.3 g/dL (ref 31.5–35.7)
MCV: 90 fL (ref 79–97)
Monocytes Absolute: 0.5 10*3/uL (ref 0.1–0.9)
Monocytes: 9 %
NEUTROS PCT: 57 %
Neutrophils Absolute: 3.1 10*3/uL (ref 1.4–7.0)
Platelets: 236 10*3/uL (ref 150–450)
RBC: 4.82 x10E6/uL (ref 4.14–5.80)
RDW: 12.4 % (ref 12.3–15.4)
WBC: 5.5 10*3/uL (ref 3.4–10.8)

## 2018-01-13 LAB — COMPREHENSIVE METABOLIC PANEL WITH GFR
ALT: 26 IU/L (ref 0–44)
AST: 25 IU/L (ref 0–40)
Albumin/Globulin Ratio: 2.3 — ABNORMAL HIGH (ref 1.2–2.2)
Albumin: 4.5 g/dL (ref 3.6–4.8)
Alkaline Phosphatase: 73 IU/L (ref 39–117)
BUN/Creatinine Ratio: 13 (ref 10–24)
BUN: 10 mg/dL (ref 8–27)
Bilirubin Total: 0.7 mg/dL (ref 0.0–1.2)
CO2: 25 mmol/L (ref 20–29)
Calcium: 9.6 mg/dL (ref 8.6–10.2)
Chloride: 97 mmol/L (ref 96–106)
Creatinine, Ser: 0.8 mg/dL (ref 0.76–1.27)
GFR calc Af Amer: 110 mL/min/1.73
GFR calc non Af Amer: 95 mL/min/1.73
Globulin, Total: 2 g/dL (ref 1.5–4.5)
Glucose: 92 mg/dL (ref 65–99)
Potassium: 5.2 mmol/L (ref 3.5–5.2)
Sodium: 136 mmol/L (ref 134–144)
Total Protein: 6.5 g/dL (ref 6.0–8.5)

## 2018-01-13 LAB — LIPID PANEL
Chol/HDL Ratio: 2.2 ratio (ref 0.0–5.0)
Cholesterol, Total: 202 mg/dL — ABNORMAL HIGH (ref 100–199)
HDL: 91 mg/dL
LDL Calculated: 102 mg/dL — ABNORMAL HIGH (ref 0–99)
Triglycerides: 44 mg/dL (ref 0–149)
VLDL Cholesterol Cal: 9 mg/dL (ref 5–40)

## 2018-02-03 ENCOUNTER — Telehealth: Payer: Self-pay | Admitting: Family Medicine

## 2018-02-03 NOTE — Telephone Encounter (Signed)
P.A. TADALAFIL already completed so I called t# [phone] to check on it and insurance requires a trial of Sildenafil before this can be approved.  Called pt and he hasn't tried Viagra in the past and is ok trying it.  Do you want to switch?

## 2018-02-03 NOTE — Telephone Encounter (Signed)
ok 

## 2018-02-05 MED ORDER — SILDENAFIL CITRATE 100 MG PO TABS: ORAL_TABLET | ORAL | 5 refills | Status: DC

## 2018-02-05 NOTE — Telephone Encounter (Signed)
Called Megal at [facility] and [facility] pays for #6 sildenafil 100mg , changed Rx to that.

## 2018-04-01 ENCOUNTER — Ambulatory Visit: Payer: 59 | Admitting: Psychiatry

## 2018-04-01 ENCOUNTER — Encounter: Payer: Self-pay | Admitting: Psychiatry

## 2018-04-01 VITALS — BP 142/83 | HR 84

## 2018-04-01 DIAGNOSIS — F99 Mental disorder, not otherwise specified: Secondary | ICD-10-CM

## 2018-04-01 DIAGNOSIS — F411 Generalized anxiety disorder: Secondary | ICD-10-CM | POA: Diagnosis not present

## 2018-04-01 DIAGNOSIS — F410 Panic disorder [episodic paroxysmal anxiety] without agoraphobia: Secondary | ICD-10-CM | POA: Diagnosis not present

## 2018-04-01 DIAGNOSIS — F5105 Insomnia due to other mental disorder: Secondary | ICD-10-CM

## 2018-04-01 DIAGNOSIS — F341 Dysthymic disorder: Secondary | ICD-10-CM | POA: Diagnosis not present

## 2018-04-01 MED ORDER — ARIPIPRAZOLE 5 MG PO TABS: 5.0000 mg | ORAL_TABLET | Freq: Every day | ORAL | 1 refills | Status: DC

## 2018-04-01 MED ORDER — ESCITALOPRAM OXALATE 20 MG PO TABS: 20.0000 mg | ORAL_TABLET | Freq: Every day | ORAL | 1 refills | Status: DC

## 2018-04-01 MED ORDER — MIRTAZAPINE 15 MG PO TABS: 15.0000 mg | ORAL_TABLET | Freq: Every day | ORAL | 1 refills | Status: DC

## 2018-04-01 MED ORDER — HYDROXYZINE HCL 10 MG PO TABS: ORAL_TABLET | ORAL | 1 refills | Status: DC

## 2018-04-01 NOTE — Progress Notes (Signed)
Nathaniel Pena [id number] [birth year] 62 y.o.  Subjective:   Patient ID:  Nathaniel Pena is a 62 y.o. (DOB [birth year]) male.  Chief Complaint:  Chief Complaint  Patient presents with  . Follow-up    h/o Anxiety, depression, and insomnia    HPI Nathaniel Forts Bodiford presents to the office today for follow-up of anxiety, depression and insomnia. Pt reports "I still like where I am at with my meds." He reports that he has some occasional mild depressed moods- "not bad at all." He reports that anxiety has been "good." He reports he occasionally feels like anxiety is starting but it never progresses. He reports that he sleeps well with Mirtazapine. Appetite is stable. Energy is adequate. Reports motivation is somewhat low and that he is getting out of the house and doing things. Works 1/2 day everyday. Denies impaired concentration. Denies SI.  Denies any new stressors. He reports that he and his wife remain separated without any legal proceedings.   Review of Systems:  Review of Systems  Gastrointestinal: Negative.   Musculoskeletal: Negative for gait problem.  Neurological: Negative for tremors.  Psychiatric/Behavioral:       Please refer to HPI   Recent physical exam and reports everything was WNL.  Medications: I have reviewed the patient's current medications.  Current Outpatient Medications  Medication Sig Dispense Refill  . ARIPiprazole (ABILIFY) 5 MG tablet Take 1 tablet (5 mg total) by mouth daily. 90 tablet 1  . escitalopram (LEXAPRO) 20 MG tablet Take 1 tablet (20 mg total) by mouth daily. 90 tablet 1  . lisinopril-hydrochlorothiazide (PRINZIDE,ZESTORETIC) 20-12.5 MG tablet Take 1 tablet by mouth daily. 90 tablet 3  . mirtazapine (REMERON) 15 MG tablet Take 1 tablet (15 mg total) by mouth at bedtime. 90 tablet 1  . pantoprazole (PROTONIX) 40 MG tablet Take 1 tablet (40 mg total) by mouth daily. 90 tablet 1  . thiamine 100 MG tablet Take 1 tablet (100 mg total) by mouth daily.  30 tablet 0  . aspirin 81 MG chewable tablet Chew 81 mg by mouth every 6 (six) hours as needed for mild pain.    . hydrOXYzine (ATARAX/VISTARIL) 10 MG tablet Take 1-2 tabs po q 4 hours prn anxiety 420 tablet 1  . sildenafil (VIAGRA) 100 MG tablet Take 1/2 to 1 prn erectile dysfunction 6 tablet 5   No current facility-administered medications for this visit.     Medication Side Effects: None  Allergies: No Known Allergies  Past Medical History:  Diagnosis Date  . Colonic polyp   . GAD (generalized anxiety disorder)   . GERD (gastroesophageal reflux disease)   . Hypertension   . Seizures ([place])   . Tobacco abuse    smoker for 25 years. quit in 2002    Family History  Problem Relation Age of Onset  . Anxiety disorder Mother   . Depression Mother   . Hypertension Mother   . Hypertension Brother   . Depression Brother   . Colon cancer Paternal Grandfather   . Anxiety disorder Daughter   . ADD / ADHD Daughter   . ADD / ADHD Son   . Anxiety disorder Son     Social History   Socioeconomic History  . Marital status: Married    Spouse name: Not on file  . Number of children: Not on file  . Years of education: Not on file  . Highest education level: Not on file  Occupational History  . Not on file  Social  Needs  . Financial resource strain: Not on file  . Food insecurity:    Worry: Not on file    Inability: Not on file  . Transportation needs:    Medical: Not on file    Non-medical: Not on file  Tobacco Use  . Smoking status: Current Every Day Smoker    Packs/day: 1.00  . Smokeless tobacco: Former Systems developer    Types: Snuff  Substance and Sexual Activity  . Alcohol use: Yes    Alcohol/week: 54.0 standard drinks    Types: 42 Cans of beer, 12 Standard drinks or equivalent per week  . Drug use: No  . Sexual activity: Yes  Lifestyle  . Physical activity:    Days per week: Not on file    Minutes per session: Not on file  . Stress: Not on file  Relationships  . Social  connections:    Talks on phone: Not on file    Gets together: Not on file    Attends religious service: Not on file    Active member of club or organization: Not on file    Attends meetings of clubs or organizations: Not on file    Relationship status: Not on file  . Intimate partner violence:    Fear of current or ex partner: Not on file    Emotionally abused: Not on file    Physically abused: Not on file    Forced sexual activity: Not on file  Other Topics Concern  . Not on file  Social History Narrative  . Not on file    Past Medical History, Surgical history, Social history, and Family history were reviewed and updated as appropriate.   Please see review of systems for further details on the patient's review from today.   Objective:   Physical Exam:  BP (!) 142/83   Pulse 84   Physical Exam  Constitutional: He is oriented to person, place, and time. He appears well-developed. No distress.  Musculoskeletal: He exhibits no deformity.  Neurological: He is alert and oriented to person, place, and time. Coordination normal.  Psychiatric: His speech is normal and behavior is normal. Judgment and thought content normal. His mood appears not anxious. His affect is not angry, not blunt, not labile and not inappropriate. Cognition and memory are normal. He does not exhibit a depressed mood. He expresses no homicidal and no suicidal ideation. He expresses no suicidal plans and no homicidal plans.  Mood presents as dysthymic.  Insight intact. No auditory or visual hallucinations. No delusions.     Lab Review:     Component Value Date/Time   NA 136 01/12/2018 1133   K 5.2 01/12/2018 1133   CL 97 01/12/2018 1133   CO2 25 01/12/2018 1133   GLUCOSE 92 01/12/2018 1133   GLUCOSE 82 06/03/2016 1211   BUN 10 01/12/2018 1133   CREATININE 0.80 01/12/2018 1133   CREATININE 0.66 (L) 06/03/2016 1211   CALCIUM 9.6 01/12/2018 1133   PROT 6.5 01/12/2018 1133   ALBUMIN 4.5 01/12/2018 1133    AST 25 01/12/2018 1133   ALT 26 01/12/2018 1133   ALKPHOS 73 01/12/2018 1133   BILITOT 0.7 01/12/2018 1133   GFRNONAA 95 01/12/2018 1133   GFRAA 110 01/12/2018 1133       Component Value Date/Time   WBC 5.5 01/12/2018 1133   WBC 6.4 06/03/2016 1211   RBC 4.82 01/12/2018 1133   RBC 4.52 06/03/2016 1211   HGB 14.9 01/12/2018 1133   HCT 43.5  01/12/2018 1133   PLT 236 01/12/2018 1133   MCV 90 01/12/2018 1133   MCH 30.9 01/12/2018 1133   MCH 33.8 (H) 06/03/2016 1211   MCHC 34.3 01/12/2018 1133   MCHC 34.6 06/03/2016 1211   RDW 12.4 01/12/2018 1133   LYMPHSABS 1.7 01/12/2018 1133   MONOABS 1,024 (H) 06/03/2016 1211   EOSABS 0.1 01/12/2018 1133   BASOSABS 0.0 01/12/2018 1133    No results found for: POCLITH, LITHIUM   No results found for: PHENYTOIN, PHENOBARB, VALPROATE, CBMZ   .res Assessment: Plan:    Generalized anxiety disorder - Plan: escitalopram (LEXAPRO) 20 MG tablet, hydrOXYzine (ATARAX/VISTARIL) 10 MG tablet  Panic disorder - Plan: escitalopram (LEXAPRO) 20 MG tablet, hydrOXYzine (ATARAX/VISTARIL) 10 MG tablet  Dysthymic disorder - Plan: escitalopram (LEXAPRO) 20 MG tablet, mirtazapine (REMERON) 15 MG tablet, ARIPiprazole (ABILIFY) 5 MG tablet  Insomnia due to other mental disorder - Plan: mirtazapine (REMERON) 15 MG tablet  Please see After Visit Summary for patient specific instructions.  Future Appointments  Date Time Provider [place]  09/30/2018 11:30 AM Thayer Headings, PMHNP [facility] None    No orders of the defined types were placed in this encounter.     -------------------------------

## 2018-06-22 ENCOUNTER — Encounter: Payer: Self-pay | Admitting: Emergency Medicine

## 2018-06-22 DIAGNOSIS — F410 Panic disorder [episodic paroxysmal anxiety] without agoraphobia: Secondary | ICD-10-CM

## 2018-06-22 DIAGNOSIS — F411 Generalized anxiety disorder: Secondary | ICD-10-CM | POA: Insufficient documentation

## 2018-08-06 ENCOUNTER — Encounter: Payer: Self-pay | Admitting: Medical

## 2018-08-06 ENCOUNTER — Other Ambulatory Visit: Payer: Self-pay

## 2018-08-06 ENCOUNTER — Ambulatory Visit: Payer: 59 | Admitting: Medical

## 2018-08-06 VITALS — BP 140/90 | HR 86 | Temp 98.0°F | Resp 16 | Ht 76.0 in | Wt 216.4 lb

## 2018-08-06 DIAGNOSIS — J988 Other specified respiratory disorders: Secondary | ICD-10-CM | POA: Diagnosis not present

## 2018-08-06 DIAGNOSIS — R059 Cough, unspecified: Secondary | ICD-10-CM

## 2018-08-06 DIAGNOSIS — R05 Cough: Secondary | ICD-10-CM | POA: Diagnosis not present

## 2018-08-06 MED ORDER — AMOXICILLIN 875 MG PO TABS: 875.0000 mg | ORAL_TABLET | Freq: Two times a day (BID) | ORAL | 0 refills | Status: DC

## 2018-08-06 NOTE — Patient Instructions (Signed)
Your symptoms and exam suggest respiratory tract infection/bronchitis  I recommend you rest, hydrate well with clear fluids, I recommend you begin Mucinex DM over-the-counter to help with cough and congestion.  He can use Tylenol for fever or not feeling well.  Begin amoxicillin antibiotic.  If not much improved within the next 4 to 5 days let us know.  For your protection and for others, stay at home and avoid contact with others if at all possible the next several days until you are fever free and having no symptoms for at least 2 to 3 days

## 2018-08-06 NOTE — Progress Notes (Signed)
Subjective:     Patient ID: Nathaniel Pena, male   DOB: [birth year], 64 y.o.   MRN: [id number]  HPI Chief Complaint  Patient presents with  . Cough    fever, cough non productive, SOB, weakness X 07-29-18   He notes persistent fever, has persistent nonproductive cough, low grade headache.   Has had some chills x 2 days initially.   No body aches, no weakness, no sore throat.   Started 07/29/2018.    No sick contacts, no known exposure to person of interest with Coronavirus.   Fever was 102 the first day, but then stayed about 99.5.   Fever has gradually resolved, but the cough has persisted.  Using nothing for symptoms.    Still smokes.  No recent travel.  No other aggravating or relieving factors. No other complaint.   Past Medical History:  Diagnosis Date  . Colonic polyp   . GAD (generalized anxiety disorder)   . GERD (gastroesophageal reflux disease)   . Hypertension   . Seizures ([place])   . Tobacco abuse    smoker for 25 years. quit in 2002   Current Outpatient Medications on File Prior to Visit  Medication Sig Dispense Refill  . ARIPiprazole (ABILIFY) 5 MG tablet Take 1 tablet (5 mg total) by mouth daily. 90 tablet 1  . aspirin 81 MG chewable tablet Chew 81 mg by mouth every 6 (six) hours as needed for mild pain.    Marland Kitchen escitalopram (LEXAPRO) 20 MG tablet Take 1 tablet (20 mg total) by mouth daily. 90 tablet 1  . hydrOXYzine (ATARAX/VISTARIL) 10 MG tablet Take 1-2 tabs po q 4 hours prn anxiety 420 tablet 1  . lisinopril-hydrochlorothiazide (PRINZIDE,ZESTORETIC) 20-12.5 MG tablet Take 1 tablet by mouth daily. 90 tablet 3  . mirtazapine (REMERON) 15 MG tablet Take 1 tablet (15 mg total) by mouth at bedtime. 90 tablet 1  . pantoprazole (PROTONIX) 40 MG tablet Take 1 tablet (40 mg total) by mouth daily. 90 tablet 1  . sildenafil (VIAGRA) 100 MG tablet Take 1/2 to 1 prn erectile dysfunction 6 tablet 5  . thiamine 100 MG tablet Take 1 tablet (100 mg total) by mouth daily. (Patient not  taking: Reported on 08/06/2018) 30 tablet 0   No current facility-administered medications on file prior to visit.    Review of Systems As in subjective    Objective:   Physical Exam BP 140/90   Pulse 86   Temp 98 F (36.7 C) (Oral)   Resp 16   Ht 6\' 4"  (1.93 m)   Wt 216 lb 6.4 oz (98.2 kg)   SpO2 96%   BMI 26.34 kg/m   Wt Readings from Last 3 Encounters:  08/06/18 216 lb 6.4 oz (98.2 kg)  01/12/18 222 lb 12.8 oz (101.1 kg)  04/06/17 220 lb 12.8 oz (100.2 kg)   General appearance: Alert, WD/WN, no distress, somewhat ill appearing                             Skin: warm, no rash, no diaphoresis                           Head: no sinus tenderness                            Eyes: conjunctiva normal, corneas clear, PERRLA  Ears: pearly TMs, external ear canals normal                          Nose: septum midline, turbinates swollen, with erythema and clear discharge             Mouth/throat: MMM, tongue normal, mild pharyngeal erythema                           Neck: supple, no adenopathy, no thyromegaly, non tender                          Heart: RRR, normal S1, S2, no murmurs                         Lungs: +bronchial breath sounds, +scattered rhonchi, no wheezes, no rales                Extremities: no edema, non tender         Assessment:     Encounter Diagnoses  Name Primary?  . Cough Yes  . Respiratory tract infection         Plan:     Discussed symptoms, concerns, and discussed treatment below.  Patient Instructions  Your symptoms and exam suggest respiratory tract infection/bronchitis  I recommend you rest, hydrate well with clear fluids, I recommend you begin Mucinex DM over-the-counter to help with cough and congestion.  He can use Tylenol for fever or not feeling well.  Begin amoxicillin antibiotic.  If not much improved within the next 4 to 5 days let us know.  For your protection and for others, stay at home and avoid contact  with others if at all possible the next several days until you are fever free and having no symptoms for at least 2 to 3 days    Nathaniel Pena was seen today for cough.  Diagnoses and all orders for this visit:  Cough  Respiratory tract infection  Other orders -     amoxicillin (AMOXIL) 875 MG tablet; Take 1 tablet (875 mg total) by mouth 2 (two) times daily.

## 2018-09-30 ENCOUNTER — Other Ambulatory Visit: Payer: Self-pay

## 2018-09-30 ENCOUNTER — Ambulatory Visit: Payer: 59 | Admitting: Psychiatry

## 2018-09-30 ENCOUNTER — Encounter: Payer: Self-pay | Admitting: Psychiatry

## 2018-09-30 DIAGNOSIS — F411 Generalized anxiety disorder: Secondary | ICD-10-CM | POA: Diagnosis not present

## 2018-09-30 DIAGNOSIS — F341 Dysthymic disorder: Secondary | ICD-10-CM

## 2018-09-30 DIAGNOSIS — F5105 Insomnia due to other mental disorder: Secondary | ICD-10-CM | POA: Diagnosis not present

## 2018-09-30 DIAGNOSIS — F410 Panic disorder [episodic paroxysmal anxiety] without agoraphobia: Secondary | ICD-10-CM | POA: Diagnosis not present

## 2018-09-30 DIAGNOSIS — F99 Mental disorder, not otherwise specified: Secondary | ICD-10-CM

## 2018-09-30 MED ORDER — ESCITALOPRAM OXALATE 20 MG PO TABS: 20.0000 mg | ORAL_TABLET | Freq: Every day | ORAL | 2 refills | Status: DC

## 2018-09-30 MED ORDER — HYDROXYZINE HCL 10 MG PO TABS: ORAL_TABLET | ORAL | 2 refills | Status: DC

## 2018-09-30 MED ORDER — ARIPIPRAZOLE 5 MG PO TABS: 5.0000 mg | ORAL_TABLET | Freq: Every day | ORAL | 2 refills | Status: DC

## 2018-09-30 MED ORDER — MIRTAZAPINE 15 MG PO TABS: 15.0000 mg | ORAL_TABLET | Freq: Every evening | ORAL | 2 refills | Status: DC | PRN

## 2018-09-30 NOTE — Progress Notes (Signed)
Nathaniel Pena [id number] April 17, 1956 63 y.o.  Virtual Visit via Telephone Note  I connected with@ on 09/30/18 at 11:30 AM EDT by telephone and verified that I am speaking with the correct person using two identifiers.   I discussed the limitations, risks, security and privacy concerns of performing an evaluation and management service by telephone and the availability of in person appointments. I also discussed with the patient that there may be a patient responsible charge related to this service. The patient expressed understanding and agreed to proceed.   I discussed the assessment and treatment plan with the patient. The patient was provided an opportunity to ask questions and all were answered. The patient agreed with the plan and demonstrated an understanding of the instructions.   The patient was advised to call back or seek an in-person evaluation if the symptoms worsen or if the condition fails to improve as anticipated.  I provided 30 minutes of non-face-to-face time during this encounter.  The patient was located at home.  The provider was located at home.   Thayer Headings, PMHNP   Subjective:   Patient ID:  Nathaniel Pena is a 63 y.o. (DOB 12-Mar-1956) male.  Chief Complaint:  Chief Complaint  Patient presents with  . Follow-up    h/o anxiety, depression, and insomnia    HPI Nathaniel Pena presents for follow-up of depression, anxiety, and insomnia. Denies any recent depression or anxiety. He reports that he is sleeping well and feels rested upon awakening. He reports that he has been taking Remeron only as needed for insomnia. Appetite has been stable. He reports that his energy is ok. Reports improved motivation and is able to keep up with things around the teeth. He reports that his concentration has been adequate. Denies SI.  Working Monday mornings from 7-11 am. Reports that he has been staying home and only going to the grocery store and golf course.   "I've  been doing quite well."  He reports taking hydroxyzine typically only twice a day and usually takes 2 tabs.  Reports that he continues to drink 3-4 beers daily and does not want to stop at this time and does not think his ETOH use is causing any problems.   Reports that his children are doing well. Reports that he and his son are now living together and this is working out well.   Review of Systems:  Review of Systems  Musculoskeletal: Negative for gait problem.  Neurological: Negative for tremors.       Denies involuntary movements.  Psychiatric/Behavioral:       Please refer to HPI    Medications: I have reviewed the patient's current medications.  Current Outpatient Medications  Medication Sig Dispense Refill  . aspirin 81 MG chewable tablet Chew 81 mg by mouth every 6 (six) hours as needed for mild pain.    . hydrOXYzine (ATARAX/VISTARIL) 10 MG tablet Take 1-2 tabs po q 4 hours prn anxiety 420 tablet 2  . lisinopril-hydrochlorothiazide (PRINZIDE,ZESTORETIC) 20-12.5 MG tablet Take 1 tablet by mouth daily. 90 tablet 3  . pantoprazole (PROTONIX) 40 MG tablet Take 1 tablet (40 mg total) by mouth daily. 90 tablet 1  . amoxicillin (AMOXIL) 875 MG tablet Take 1 tablet (875 mg total) by mouth 2 (two) times daily. (Patient not taking: Reported on 09/30/2018) 20 tablet 0  . ARIPiprazole (ABILIFY) 5 MG tablet Take 1 tablet (5 mg total) by mouth daily. 90 tablet 2  . escitalopram (LEXAPRO) 20 MG tablet  Take 1 tablet (20 mg total) by mouth daily. 90 tablet 2  . mirtazapine (REMERON) 15 MG tablet Take 1 tablet (15 mg total) by mouth at bedtime as needed. 90 tablet 2  . sildenafil (VIAGRA) 100 MG tablet Take 1/2 to 1 prn erectile dysfunction 6 tablet 5  . thiamine 100 MG tablet Take 1 tablet (100 mg total) by mouth daily. (Patient not taking: Reported on 08/06/2018) 30 tablet 0   No current facility-administered medications for this visit.     Medication Side Effects: None  Allergies: No  Known Allergies  Past Medical History:  Diagnosis Date  . Colonic polyp   . GAD (generalized anxiety disorder)   . GERD (gastroesophageal reflux disease)   . Hypertension   . Seizures ([place])   . Tobacco abuse    smoker for 25 years. quit in 2002    Family History  Problem Relation Age of Onset  . Anxiety disorder Mother   . Depression Mother   . Hypertension Mother   . Hypertension Brother   . Depression Brother   . Colon cancer Paternal Grandfather   . Anxiety disorder Daughter   . ADD / ADHD Daughter   . ADD / ADHD Son   . Anxiety disorder Son     Social History   Socioeconomic History  . Marital status: Married    Spouse name: Not on file  . Number of children: Not on file  . Years of education: Not on file  . Highest education level: Not on file  Occupational History  . Not on file  Social Needs  . Financial resource strain: Not on file  . Food insecurity:    Worry: Not on file    Inability: Not on file  . Transportation needs:    Medical: Not on file    Non-medical: Not on file  Tobacco Use  . Smoking status: Current Every Day Smoker    Packs/day: 1.00  . Smokeless tobacco: Former Systems developer    Types: Snuff  Substance and Sexual Activity  . Alcohol use: Yes    Alcohol/week: 54.0 standard drinks    Types: 42 Cans of beer, 12 Standard drinks or equivalent per week  . Drug use: No  . Sexual activity: Yes  Lifestyle  . Physical activity:    Days per week: Not on file    Minutes per session: Not on file  . Stress: Not on file  Relationships  . Social connections:    Talks on phone: Not on file    Gets together: Not on file    Attends religious service: Not on file    Active member of club or organization: Not on file    Attends meetings of clubs or organizations: Not on file    Relationship status: Not on file  . Intimate partner violence:    Fear of current or ex partner: Not on file    Emotionally abused: Not on file    Physically abused: Not on file     Forced sexual activity: Not on file  Other Topics Concern  . Not on file  Social History Narrative  . Not on file    Past Medical History, Surgical history, Social history, and Family history were reviewed and updated as appropriate.   Please see review of systems for further details on the patient's review from today.   Objective:   Physical Exam:  There were no vitals taken for this visit.  Physical Exam Neurological:  Mental Status: He is alert and oriented to person, place, and time.     Cranial Nerves: No dysarthria.  Psychiatric:        Attention and Perception: Attention normal.        Mood and Affect: Mood normal.        Speech: Speech normal.        Behavior: Behavior is cooperative.        Thought Content: Thought content normal. Thought content is not paranoid or delusional. Thought content does not include homicidal or suicidal ideation. Thought content does not include homicidal or suicidal plan.        Cognition and Memory: Cognition and memory normal.        Judgment: Judgment normal.     Lab Review:     Component Value Date/Time   NA 136 01/12/2018 1133   K 5.2 01/12/2018 1133   CL 97 01/12/2018 1133   CO2 25 01/12/2018 1133   GLUCOSE 92 01/12/2018 1133   GLUCOSE 82 06/03/2016 1211   BUN 10 01/12/2018 1133   CREATININE 0.80 01/12/2018 1133   CREATININE 0.66 (L) 06/03/2016 1211   CALCIUM 9.6 01/12/2018 1133   PROT 6.5 01/12/2018 1133   ALBUMIN 4.5 01/12/2018 1133   AST 25 01/12/2018 1133   ALT 26 01/12/2018 1133   ALKPHOS 73 01/12/2018 1133   BILITOT 0.7 01/12/2018 1133   GFRNONAA 95 01/12/2018 1133   GFRAA 110 01/12/2018 1133       Component Value Date/Time   WBC 5.5 01/12/2018 1133   WBC 6.4 06/03/2016 1211   RBC 4.82 01/12/2018 1133   RBC 4.52 06/03/2016 1211   HGB 14.9 01/12/2018 1133   HCT 43.5 01/12/2018 1133   PLT 236 01/12/2018 1133   MCV 90 01/12/2018 1133   MCH 30.9 01/12/2018 1133   MCH 33.8 (H) 06/03/2016 1211   MCHC  34.3 01/12/2018 1133   MCHC 34.6 06/03/2016 1211   RDW 12.4 01/12/2018 1133   LYMPHSABS 1.7 01/12/2018 1133   MONOABS 1,024 (H) 06/03/2016 1211   EOSABS 0.1 01/12/2018 1133   BASOSABS 0.0 01/12/2018 1133    No results found for: POCLITH, LITHIUM   No results found for: PHENYTOIN, PHENOBARB, VALPROATE, CBMZ   .res Assessment: Plan:   Continue Abilify 5 mg daily for depression. Continue Lexapro 20 mg daily for mood and anxiety. Continue hydroxyzine as needed for anxiety. Continue Remeron 15 mg at bedtime as needed for insomnia. Patient to follow-up in 9 months or sooner if clinically indicated. Generalized anxiety disorder - Plan: escitalopram (LEXAPRO) 20 MG tablet, hydrOXYzine (ATARAX/VISTARIL) 10 MG tablet  Insomnia due to other mental disorder - Plan: mirtazapine (REMERON) 15 MG tablet  Dysthymic disorder - Plan: ARIPiprazole (ABILIFY) 5 MG tablet, escitalopram (LEXAPRO) 20 MG tablet, mirtazapine (REMERON) 15 MG tablet  Panic disorder - Plan: escitalopram (LEXAPRO) 20 MG tablet, hydrOXYzine (ATARAX/VISTARIL) 10 MG tablet  Please see After Visit Summary for patient specific instructions.  Future Appointments  Date Time Provider [place]  06/30/2019 11:30 AM Thayer Headings, PMHNP [facility] None    No orders of the defined types were placed in this encounter.     -------------------------------

## 2018-11-29 IMAGING — CR DG CHEST 1V PORT
1 series · 1 of 1 positions shown · non-contrast
Comparison: None.

CLINICAL DATA: Syncope, unwitnessed falls

EXAM:
PORTABLE CHEST 1 VIEW

[AP]
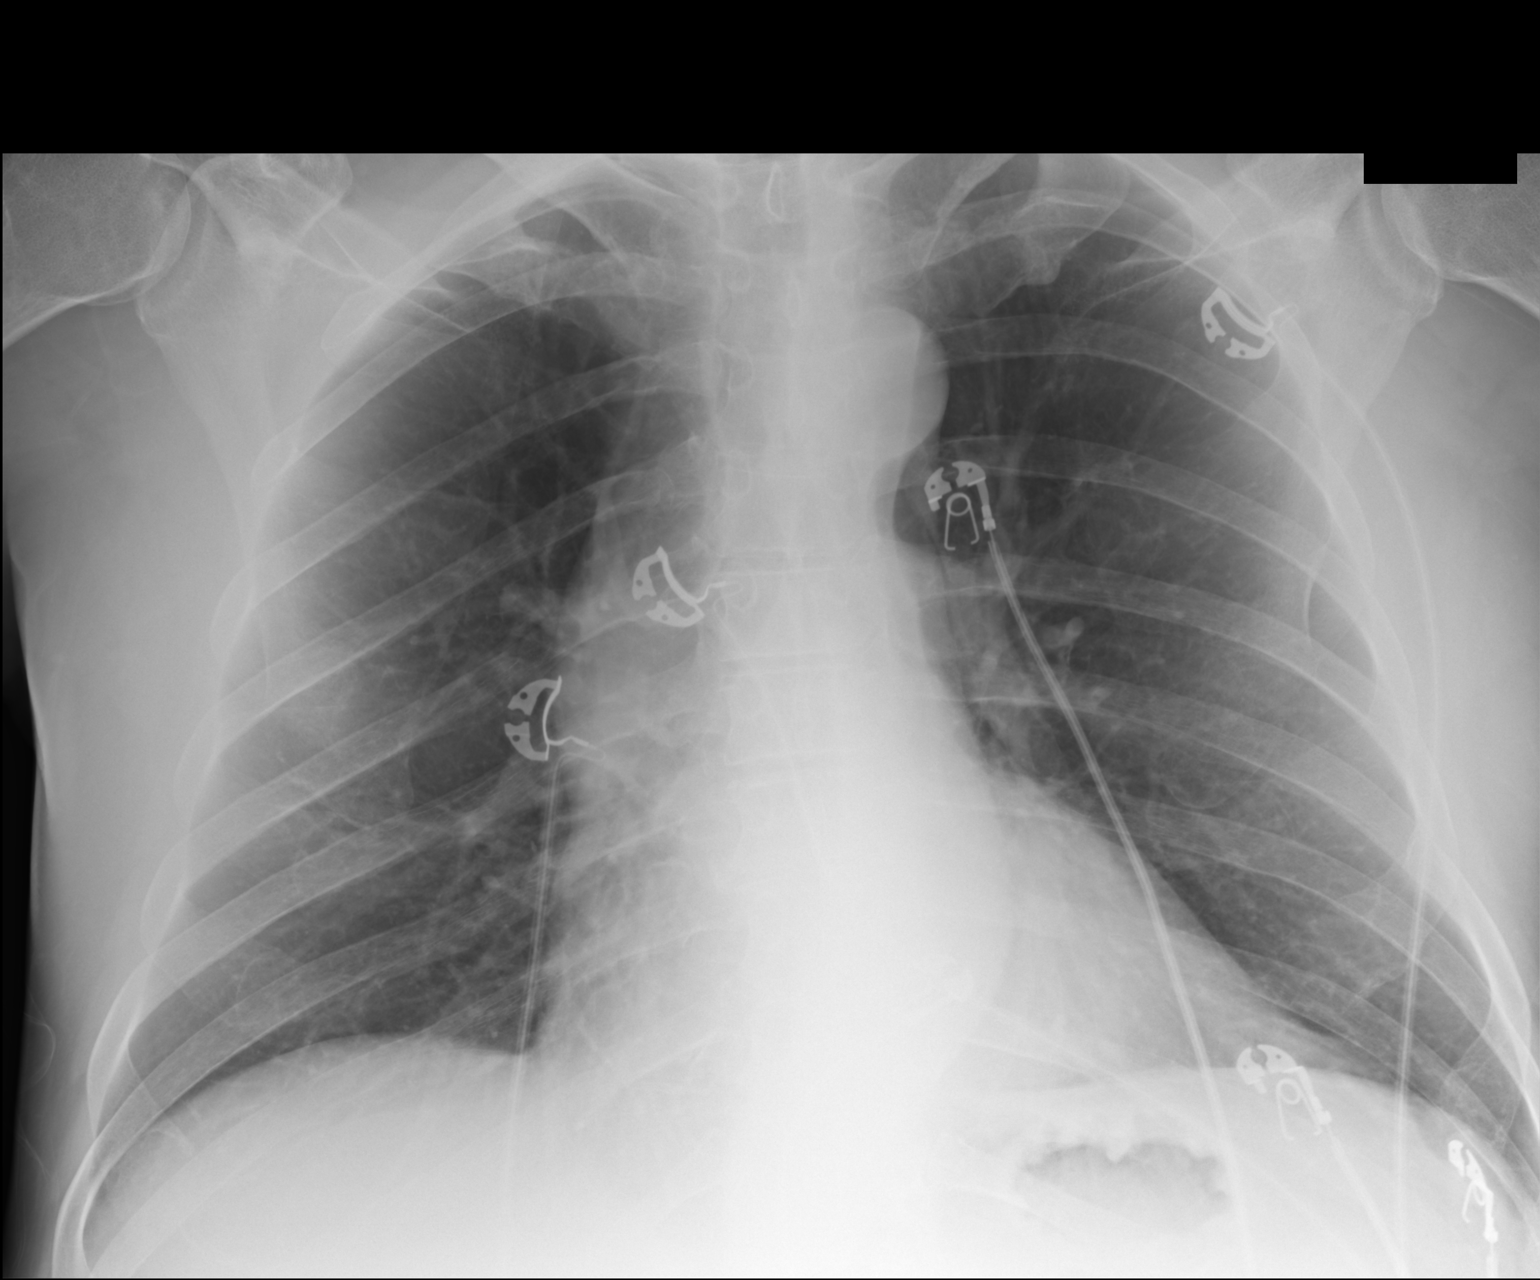

[1 of 1 positions shown; findings below may reference images not displayed]

FINDINGS: The heart size and mediastinal contours are within normal limits.
Both lungs are clear. The visualized skeletal structures are
unremarkable.
IMPRESSION: No active disease.

## 2018-11-29 IMAGING — CT CT HEAD W/O CM
3 series · 15 of 47 positions shown, 18 images · non-contrast
Comparison: None.

CLINICAL DATA: Persistent nausea for couple weeks.

EXAM:
CT HEAD WITHOUT CONTRAST
TECHNIQUE: Contiguous axial images were obtained from the base of the skull
through the vertex without intravenous contrast.

[Series 2: head 5.0 h30s · axial · 0.50mm/px · z∈[-64,+81]mm · 9 of 35 slices shown, 12 images]
[im 3/35  brain]
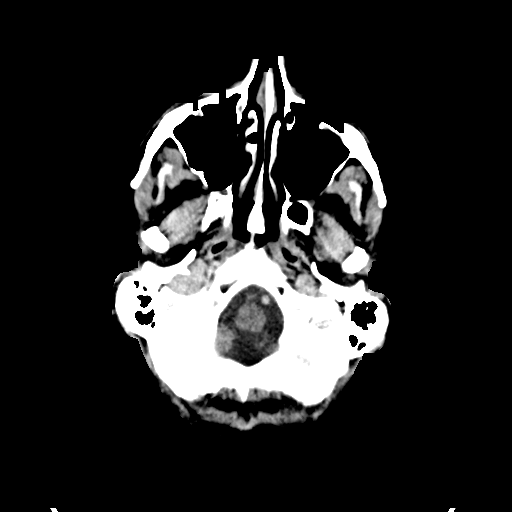
[im 3/35  bone]
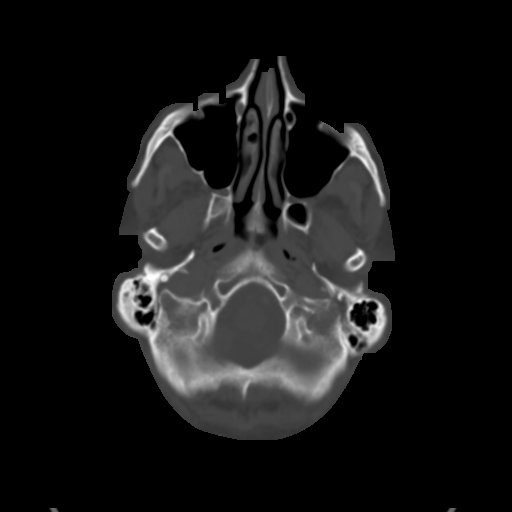
[im 6/35  brain]
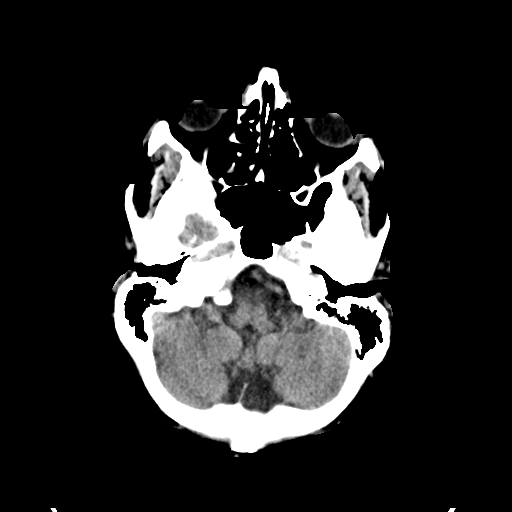
[im 10/35  brain]
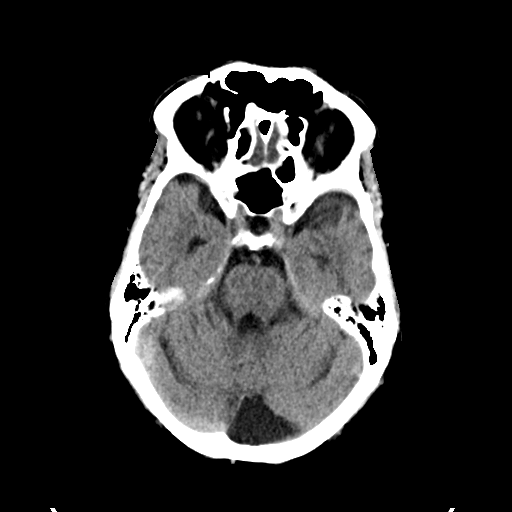
[im 13/35  brain]
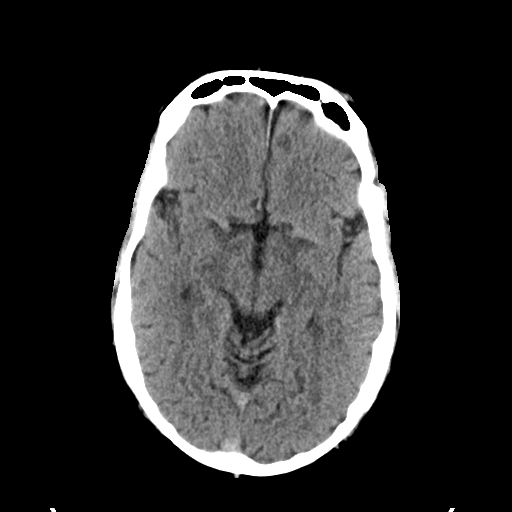
[im 18/35  brain]
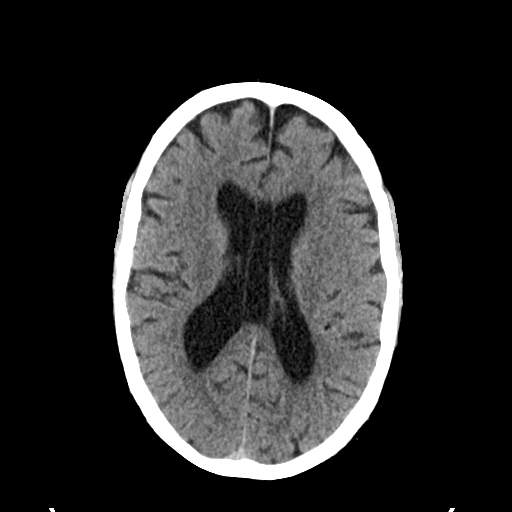
[im 18/35  bone]
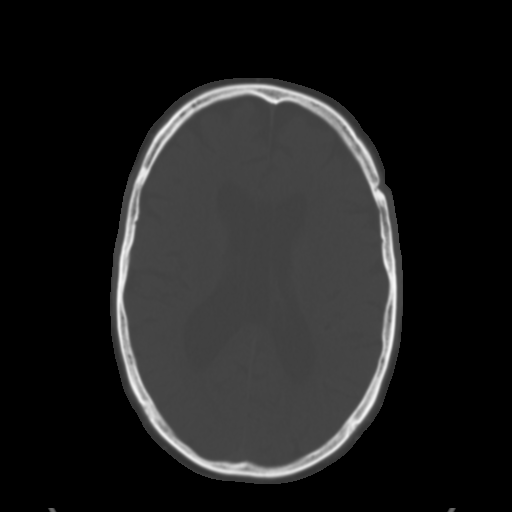
[im 22/35  brain]
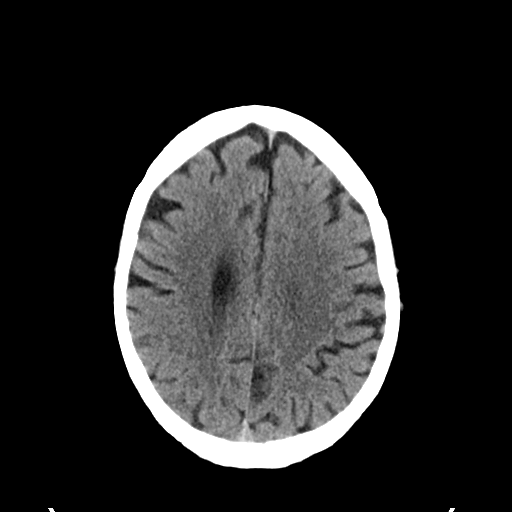
[im 25/35  brain]
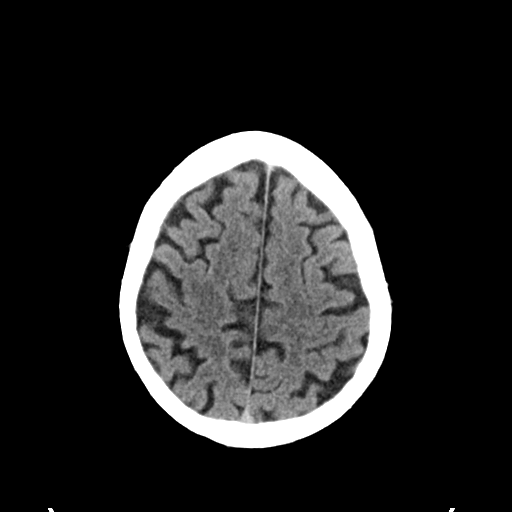
[im 29/35  brain]
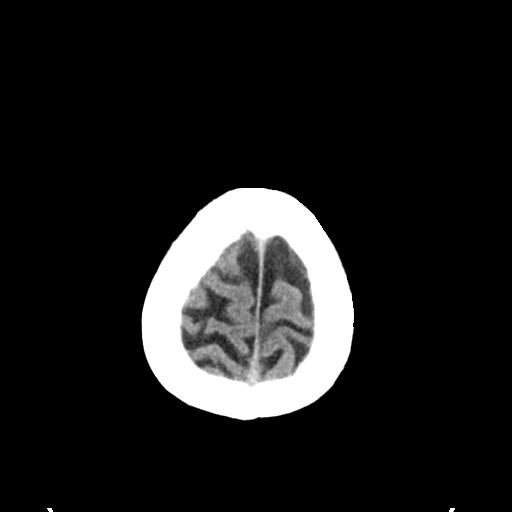
[im 32/35  brain]
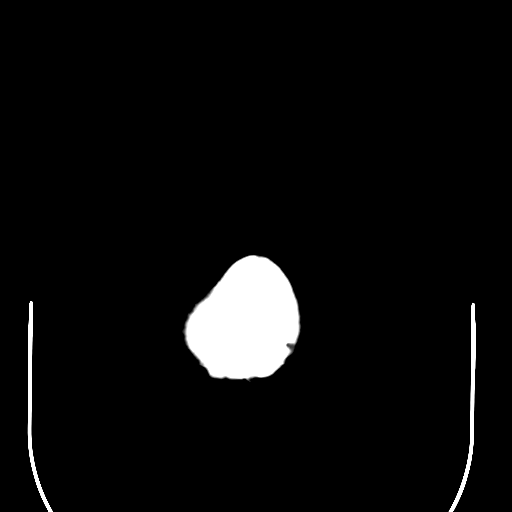
[im 32/35  bone]
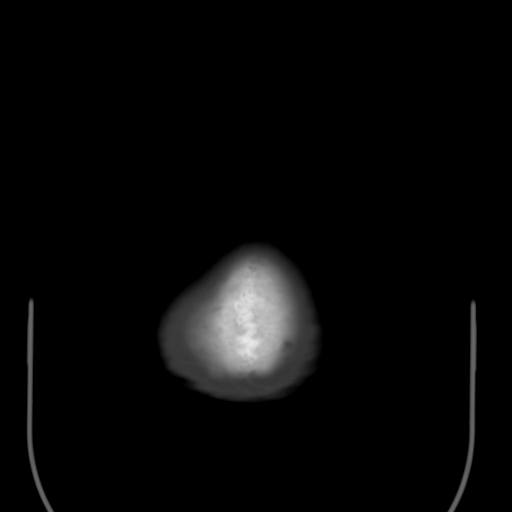

[Series 4: head 3.0 mpr cor · coronal · 0.35mm/px · 3 of 67 slices shown]
[im 23/67  brain]
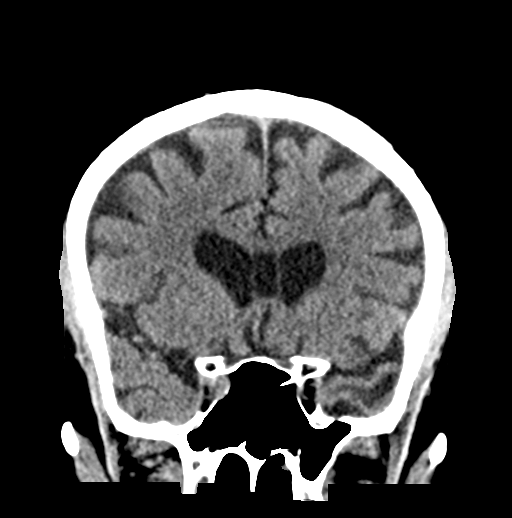
[im 30/67  brain]
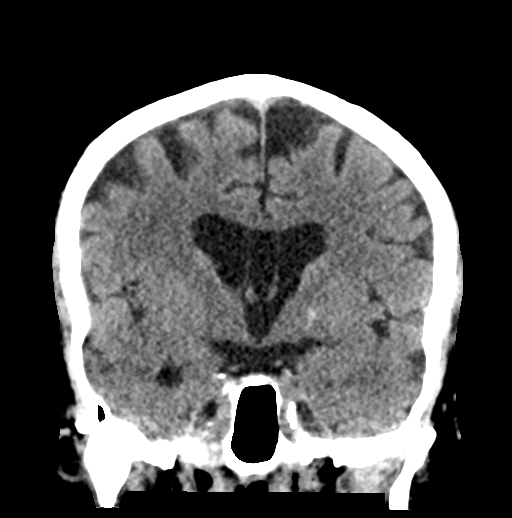
[im 37/67  brain]
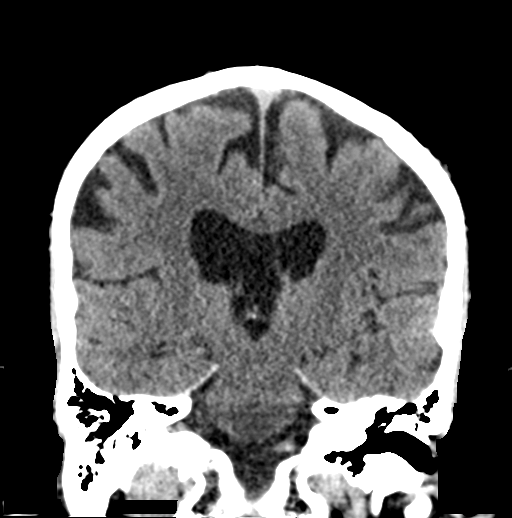

[Series 5: head 3.0 mpr sag · sagittal · 0.41mm/px · 3 of 67 slices shown]
[im 23/67  brain]
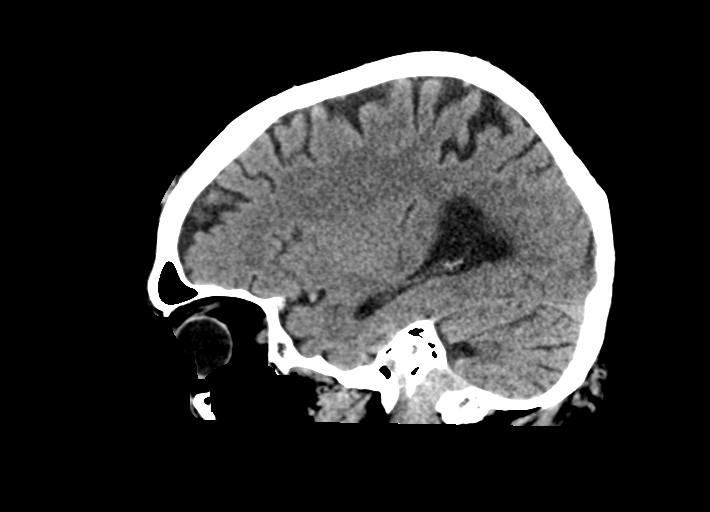
[im 34/67  brain]
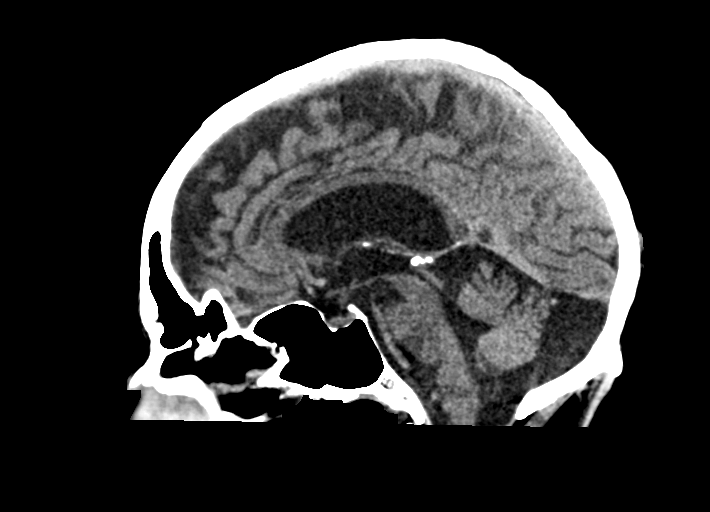
[im 45/67  brain]
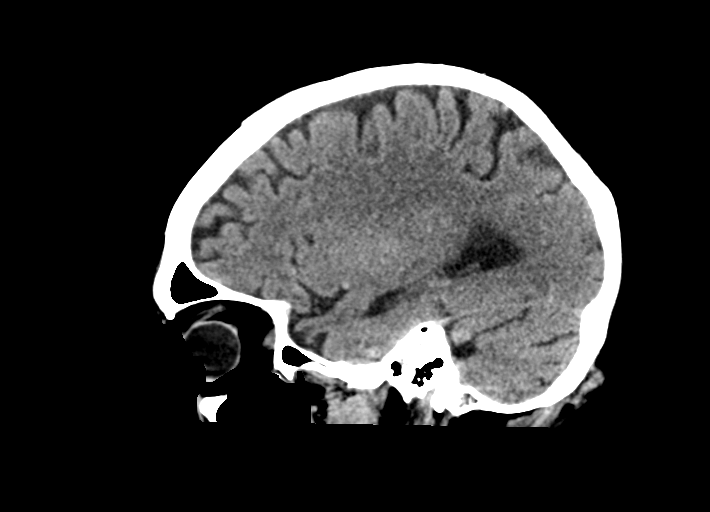

[15 of 47 positions shown; findings below may reference images not displayed]

FINDINGS: Brain: No evidence of acute infarction, hemorrhage, extra-axial
collection, ventriculomegaly, or mass effect. There is a left
posterior fossae are arachnoid cyst versus prominent asymmetric
cisterna magna. Generalized cerebral atrophy. Periventricular white
matter low attenuation likely secondary to microangiopathy.

Vascular: Cerebrovascular atherosclerotic calcifications are noted.

Skull: Negative for fracture or focal lesion.

Sinuses/Orbits: Visualized portions of the orbits are unremarkable.
The mastoid sinuses are clear. Small right maxillary sinus mucous
retention cyst versus polyp.

Other: None.
IMPRESSION: 1. No acute intracranial pathology.
2. Chronic microvascular disease and cerebral atrophy.

## 2019-01-14 ENCOUNTER — Encounter: Payer: Self-pay | Admitting: Medical

## 2019-01-14 ENCOUNTER — Ambulatory Visit: Payer: 59 | Admitting: Medical

## 2019-01-14 ENCOUNTER — Other Ambulatory Visit: Payer: Self-pay | Admitting: Family Medicine

## 2019-01-14 ENCOUNTER — Other Ambulatory Visit: Payer: Self-pay

## 2019-01-14 VITALS — BP 150/110 | HR 62 | Temp 97.9°F | Ht 76.0 in | Wt 230.8 lb

## 2019-01-14 DIAGNOSIS — I10 Essential (primary) hypertension: Secondary | ICD-10-CM | POA: Diagnosis not present

## 2019-01-14 DIAGNOSIS — K219 Gastro-esophageal reflux disease without esophagitis: Secondary | ICD-10-CM

## 2019-01-14 DIAGNOSIS — N529 Male erectile dysfunction, unspecified: Secondary | ICD-10-CM

## 2019-01-14 DIAGNOSIS — R29818 Other symptoms and signs involving the nervous system: Secondary | ICD-10-CM | POA: Diagnosis not present

## 2019-01-14 DIAGNOSIS — R0683 Snoring: Secondary | ICD-10-CM | POA: Insufficient documentation

## 2019-01-14 DIAGNOSIS — Z720 Tobacco use: Secondary | ICD-10-CM | POA: Diagnosis not present

## 2019-01-14 DIAGNOSIS — Z87891 Personal history of nicotine dependence: Secondary | ICD-10-CM | POA: Insufficient documentation

## 2019-01-14 DIAGNOSIS — F1010 Alcohol abuse, uncomplicated: Secondary | ICD-10-CM | POA: Insufficient documentation

## 2019-01-14 NOTE — Progress Notes (Signed)
Subjective: Chief Complaint  Patient presents with  . Erectile Dysfunction    wants to know if there is another treatment    Here for ED concerns.   Sildenafil no longer seems to be working.  Uses 100mg  tablet as needed.  Last time it seemed to work was over a year ago.   Gets no erections on his own.   Is estranged from wife, but has some girlfriends and they are wanting him to be sexually active.  He is compliant with his blood pressure medicine but does not check his blood pressure.  He denies chest pain, difficulty breathing, swelling in the legs.  Smokes about 1 ppd, been smoking 6 years this time.   Drinks 6 beers daily on a regular basis.  Ran out of thiamine. Takes Mirtazapine for sleep, on Lexapro daily, Abilify regularly through psychiatry.   Sees [facility].  He has tried Cialis before and that did not help either.  No other aggravating or relieving factors. No other complaint.  Past Medical History:  Diagnosis Date  . Colonic polyp   . GAD (generalized anxiety disorder)   . GERD (gastroesophageal reflux disease)   . Hypertension   . Seizures ([place])   . Tobacco abuse    smoker for 25 years. quit in 2002   Current Outpatient Medications on File Prior to Visit  Medication Sig Dispense Refill  . aspirin 81 MG chewable tablet Chew 81 mg by mouth every 6 (six) hours as needed for mild pain.    . hydrOXYzine (ATARAX/VISTARIL) 10 MG tablet Take 1-2 tabs po q 4 hours prn anxiety 420 tablet 2  . lisinopril-hydrochlorothiazide (PRINZIDE,ZESTORETIC) 20-12.5 MG tablet Take 1 tablet by mouth daily. 90 tablet 3  . pantoprazole (PROTONIX) 40 MG tablet Take 1 tablet (40 mg total) by mouth daily. 90 tablet 1  . sildenafil (VIAGRA) 100 MG tablet Take 1/2 to 1 prn erectile dysfunction 6 tablet 5  . ARIPiprazole (ABILIFY) 5 MG tablet Take 1 tablet (5 mg total) by mouth daily. 90 tablet 2  . escitalopram (LEXAPRO) 20 MG tablet Take 1 tablet (20 mg total) by mouth daily. 90 tablet 2  .  mirtazapine (REMERON) 15 MG tablet Take 1 tablet (15 mg total) by mouth at bedtime as needed. 90 tablet 2  . thiamine 100 MG tablet Take 1 tablet (100 mg total) by mouth daily. (Patient not taking: Reported on 08/06/2018) 30 tablet 0   No current facility-administered medications on file prior to visit.    ROS as in subjective  Objective: BP (!) 150/110   Pulse 62   Temp 97.9 F (36.6 C) (Oral)   Ht 6\' 4"  (1.93 m)   Wt 230 lb 12.8 oz (104.7 kg)   SpO2 97%   BMI 28.09 kg/m   Wt Readings from Last 3 Encounters:  01/14/19 230 lb 12.8 oz (104.7 kg)  08/06/18 216 lb 6.4 oz (98.2 kg)  01/12/18 222 lb 12.8 oz (101.1 kg)   BP Readings from Last 3 Encounters:  01/14/19 (!) 150/110  08/06/18 140/90  01/12/18 138/80   General appearance: alert, no distress, WD/WN somewhat unkempt and odor  Neck: supple, no lymphadenopathy, no thyromegaly, no masses, no bruits Heart: RRR, normal S1, S2, no murmurs Lungs: CTA bilaterally, no wheezes, rhonchi, or rales Abdomen: +bs, soft, non tender, non distended, no masses, no hepatomegaly, no splenomegaly, no obvious bruits Pulses: 2+ symmetric, upper and 1+ bilat lower extremities, normal cap refill Psych: pleasant, answers questions approaprialy  EKG  Indication erectile dysfunction, hypertension, rate 55 bpm, PR 164 ms, QRS 104 ms, QTC 432 ms, axis -22 degrees, sinus bradycardia with PACs, T wave inversion in 3, no change from prior EKG   Assessment: Encounter Diagnoses  Name Primary?  . Erectile dysfunction, unspecified erectile dysfunction type Yes  . Essential hypertension   . Tobacco use   . Excessive drinking alcohol   . Snoring   . Suspected sleep apnea     Plan: We discussed erectile dysfunction, causes of erectile dysfunction.  In his case it is likely multifactorial.  He has high blood pressure which is unusually high today and given the last reading months ago, he may be trending upward.  He also drinks a six pack of beer daily,  continues to smoke.  We discussed that all of these things contribute to ED.  EKG reviewed today.  We discussed heart disease risk.  I reviewed his echocardiogram from January 2018 which showed moderate concentric LVH, EF 55 to 60%, but no regional wall motion abnormality.  Aortic root was mildly dilated.  Labs as below.  Consider other treatment options for ED  Hypertension-uncontrolled, pending labs consider additional medication  Suspected sleep apnea-recommend a sleep study but he wants to wait on this.  We discussed the potential complications of sleep apnea untreated  Strongly recommend he cut way back on alcohol and quit smoking  Annie Main was seen today for erectile dysfunction.  Diagnoses and all orders for this visit:  Erectile dysfunction, unspecified erectile dysfunction type -     EKG 12-Lead -     Comprehensive metabolic panel -     CBC -     Testosterone  Essential hypertension -     EKG 12-Lead -     Comprehensive metabolic panel -     CBC  Tobacco use  Excessive drinking alcohol -     Comprehensive metabolic panel -     CBC  Snoring  Suspected sleep apnea

## 2019-01-15 LAB — COMPREHENSIVE METABOLIC PANEL
ALT: 38 IU/L (ref 0–44)
AST: 39 IU/L (ref 0–40)
Albumin/Globulin Ratio: 1.8 (ref 1.2–2.2)
Albumin: 4.4 g/dL (ref 3.8–4.8)
Alkaline Phosphatase: 71 IU/L (ref 39–117)
BUN/Creatinine Ratio: 10 (ref 10–24)
BUN: 8 mg/dL (ref 8–27)
Bilirubin Total: 0.6 mg/dL (ref 0.0–1.2)
CO2: 21 mmol/L (ref 20–29)
Calcium: 9.5 mg/dL (ref 8.6–10.2)
Chloride: 95 mmol/L — ABNORMAL LOW (ref 96–106)
Creatinine, Ser: 0.79 mg/dL (ref 0.76–1.27)
GFR calc Af Amer: 110 mL/min/{1.73_m2} (ref >59)
GFR calc non Af Amer: 95 mL/min/{1.73_m2} (ref >59)
Globulin, Total: 2.4 g/dL (ref 1.5–4.5)
Glucose: 71 mg/dL (ref 65–99)
Potassium: 5.2 mmol/L (ref 3.5–5.2)
Sodium: 133 mmol/L — ABNORMAL LOW (ref 134–144)
Total Protein: 6.8 g/dL (ref 6.0–8.5)

## 2019-01-15 LAB — TESTOSTERONE: Testosterone: 411 ng/dL (ref 264–916)

## 2019-01-15 LAB — CBC
Hematocrit: 47.3 % (ref 37.5–51.0)
Hemoglobin: 16.6 g/dL (ref 13.0–17.7)
MCH: 32.7 pg (ref 26.6–33.0)
MCHC: 35.1 g/dL (ref 31.5–35.7)
MCV: 93 fL (ref 79–97)
Platelets: 218 10*3/uL (ref 150–450)
RBC: 5.07 x10E6/uL (ref 4.14–5.80)
RDW: 13.1 % (ref 11.6–15.4)
WBC: 7.5 10*3/uL (ref 3.4–10.8)

## 2019-01-17 ENCOUNTER — Other Ambulatory Visit: Payer: Self-pay | Admitting: Medical

## 2019-01-17 DIAGNOSIS — I10 Essential (primary) hypertension: Secondary | ICD-10-CM

## 2019-01-17 MED ORDER — AMLODIPINE BESYLATE 5 MG PO TABS: 5.0000 mg | ORAL_TABLET | Freq: Every day | ORAL | 0 refills | Status: DC

## 2019-01-17 MED ORDER — ASPIRIN 81 MG PO CHEW: 81.0000 mg | CHEWABLE_TABLET | Freq: Four times a day (QID) | ORAL | 3 refills | Status: DC | PRN

## 2019-01-17 MED ORDER — STENDRA 200 MG PO TABS: 1.0000 | ORAL_TABLET | Freq: Every day | ORAL | 0 refills | Status: DC | PRN

## 2019-01-17 MED ORDER — LISINOPRIL-HYDROCHLOROTHIAZIDE 20-12.5 MG PO TABS: 1.0000 | ORAL_TABLET | Freq: Every day | ORAL | 0 refills | Status: DC

## 2019-02-17 ENCOUNTER — Other Ambulatory Visit: Payer: Self-pay

## 2019-02-17 ENCOUNTER — Ambulatory Visit: Payer: 59 | Admitting: Family Medicine

## 2019-02-17 ENCOUNTER — Encounter: Payer: Self-pay | Admitting: Family Medicine

## 2019-02-17 VITALS — BP 126/74 | HR 79 | Temp 98.2°F | Wt 224.0 lb

## 2019-02-17 DIAGNOSIS — N529 Male erectile dysfunction, unspecified: Secondary | ICD-10-CM | POA: Diagnosis not present

## 2019-02-17 DIAGNOSIS — Z23 Encounter for immunization: Secondary | ICD-10-CM | POA: Diagnosis not present

## 2019-02-17 DIAGNOSIS — I10 Essential (primary) hypertension: Secondary | ICD-10-CM

## 2019-02-17 NOTE — Progress Notes (Signed)
   Subjective:    Patient ID: Nathaniel Pena, male    DOB: 13-Sep-1955, 63 y.o.   MRN: [id number]  HPI He is here for consult concerning his blood pressure and also ADD.  He is presently taking amlodipine as well as lisinopril/HCTZ and has no difficulty with that. He recently tried [place] and in the past has also tried sildenafil as well as Levitra, none of them working for him.  He has cut back on his alcohol consumption but still does smoke.  He has a desire for sexual activity but unable to perform.   Review of Systems     Objective:   Physical Exam Alert and in no distress otherwise not examined       Assessment & Plan:  Essential hypertension  Erectile dysfunction, unspecified erectile dysfunction type - Plan: Ambulatory referral to Urology  Need for influenza vaccination - Plan: Flu Vaccine QUAD 6+ mos PF IM (Fluarix Quad PF) Discussed various other options concerning erectile dysfunction but will need to refer to urology for that.  He was comfortable with that.

## 2019-03-15 DIAGNOSIS — N5201 Erectile dysfunction due to arterial insufficiency: Secondary | ICD-10-CM | POA: Diagnosis not present

## 2019-03-17 ENCOUNTER — Other Ambulatory Visit: Payer: Self-pay | Admitting: Family Medicine

## 2019-03-17 NOTE — Telephone Encounter (Signed)
[facility] is requesting to fill pt sildenafil . I think pt is on different med . Please advise [facility]

## 2019-04-26 ENCOUNTER — Other Ambulatory Visit: Payer: Self-pay | Admitting: Medical

## 2019-06-21 ENCOUNTER — Other Ambulatory Visit: Payer: Self-pay | Admitting: Medical

## 2019-06-21 DIAGNOSIS — I10 Essential (primary) hypertension: Secondary | ICD-10-CM

## 2019-06-30 ENCOUNTER — Ambulatory Visit: Payer: 59 | Admitting: Psychiatry

## 2019-07-29 ENCOUNTER — Other Ambulatory Visit: Payer: Self-pay | Admitting: Medical

## 2019-08-08 ENCOUNTER — Other Ambulatory Visit: Payer: Self-pay | Admitting: Family Medicine

## 2019-08-08 DIAGNOSIS — K219 Gastro-esophageal reflux disease without esophagitis: Secondary | ICD-10-CM

## 2019-08-23 ENCOUNTER — Other Ambulatory Visit: Payer: Self-pay | Admitting: Psychiatry

## 2019-08-23 DIAGNOSIS — F411 Generalized anxiety disorder: Secondary | ICD-10-CM

## 2019-08-23 DIAGNOSIS — F341 Dysthymic disorder: Secondary | ICD-10-CM

## 2019-08-23 DIAGNOSIS — F410 Panic disorder [episodic paroxysmal anxiety] without agoraphobia: Secondary | ICD-10-CM

## 2019-08-29 ENCOUNTER — Encounter: Payer: Self-pay | Admitting: Family Medicine

## 2019-08-29 ENCOUNTER — Ambulatory Visit (INDEPENDENT_AMBULATORY_CARE_PROVIDER_SITE_OTHER): Payer: Self-pay | Admitting: Family Medicine

## 2019-08-29 VITALS — BP 154/80 | HR 68 | Temp 97.7°F | Wt 252.2 lb

## 2019-08-29 DIAGNOSIS — R609 Edema, unspecified: Secondary | ICD-10-CM | POA: Diagnosis not present

## 2019-08-29 DIAGNOSIS — Z87891 Personal history of nicotine dependence: Secondary | ICD-10-CM

## 2019-08-29 DIAGNOSIS — I10 Essential (primary) hypertension: Secondary | ICD-10-CM

## 2019-08-29 DIAGNOSIS — F1010 Alcohol abuse, uncomplicated: Secondary | ICD-10-CM

## 2019-08-29 DIAGNOSIS — S99929A Unspecified injury of unspecified foot, initial encounter: Secondary | ICD-10-CM

## 2019-08-29 NOTE — Progress Notes (Addendum)
   Subjective:    Patient ID: Nathaniel Pena, male    DOB: 1956-03-27, 64 y.o.   MRN: [id number]  HPI He complains of a 6-day history of bilateral lower extremity edema.  No chest pain, shortness of breath, heart rate changes.  There is no pain or itching.  No history of injury to his legs.  Of note is the fact that he did quit smoking 4 months ago. He does have underlying hypertension and continues on his present medications.  He continues to drink large amounts of alcohol. Review of Systems     Objective:   Physical Exam Alert and in no distress.  Cardiac exam shows regular rhythm without murmurs or gallops.  Lungs are clear to auscultation.  Lower extremity exam does show bilateral 2-3+ pitting edema.  Skin is normal.  It is not warm or tender. Exam of both great toes does show some recent trauma with some healing  EKG reading are correct.  EKG read by me shows nonspecific ST-T changes.  Other parameters as per      Assessment & Plan:  Peripheral edema - Plan: EKG 12-Lead, CBC with Differential/Platelet, Comprehensive metabolic panel, Lipid panel  Essential hypertension - Plan: CBC with Differential/Platelet, Comprehensive metabolic panel  Injury of toe, unspecified laterality, initial encounter  Alcohol abuse  Former smoker I explained his symptoms sound cardiac although he is not having any problem with that.  Complemented him on the fact that he quit smoking and discussed his alcohol consumption and at this point he is not interested in quitting.  Follow-up after blood work is back.  He will probably need an echocardiogram.  He would probably like to wait till he is Medicare eligible No therapy for the toe as they are healing and are related to poor fitting shoes

## 2019-08-30 LAB — CBC WITH DIFFERENTIAL/PLATELET
Basophils Absolute: 0.1 10*3/uL (ref 0.0–0.2)
Basos: 1 %
EOS (ABSOLUTE): 0.3 10*3/uL (ref 0.0–0.4)
Eos: 5 %
Hematocrit: 41.6 % (ref 37.5–51.0)
Hemoglobin: 14.6 g/dL (ref 13.0–17.7)
Immature Grans (Abs): 0 10*3/uL (ref 0.0–0.1)
Immature Granulocytes: 1 %
Lymphocytes Absolute: 2 10*3/uL (ref 0.7–3.1)
Lymphs: 32 %
MCH: 33.3 pg — ABNORMAL HIGH (ref 26.6–33.0)
MCHC: 35.1 g/dL (ref 31.5–35.7)
MCV: 95 fL (ref 79–97)
Monocytes Absolute: 0.7 10*3/uL (ref 0.1–0.9)
Monocytes: 11 %
Neutrophils Absolute: 3 10*3/uL (ref 1.4–7.0)
Neutrophils: 50 %
Platelets: 220 10*3/uL (ref 150–450)
RBC: 4.39 x10E6/uL (ref 4.14–5.80)
RDW: 12.6 % (ref 11.6–15.4)
WBC: 6 10*3/uL (ref 3.4–10.8)

## 2019-08-30 LAB — COMPREHENSIVE METABOLIC PANEL
ALT: 43 IU/L (ref 0–44)
AST: 43 IU/L — ABNORMAL HIGH (ref 0–40)
Albumin/Globulin Ratio: 2 (ref 1.2–2.2)
Albumin: 4.5 g/dL (ref 3.8–4.8)
Alkaline Phosphatase: 75 IU/L (ref 39–117)
BUN/Creatinine Ratio: 9 — ABNORMAL LOW (ref 10–24)
BUN: 6 mg/dL — ABNORMAL LOW (ref 8–27)
Bilirubin Total: 0.3 mg/dL (ref 0.0–1.2)
CO2: 25 mmol/L (ref 20–29)
Calcium: 9.2 mg/dL (ref 8.6–10.2)
Chloride: 92 mmol/L — ABNORMAL LOW (ref 96–106)
Creatinine, Ser: 0.69 mg/dL — ABNORMAL LOW (ref 0.76–1.27)
GFR calc Af Amer: 116 mL/min/{1.73_m2} (ref >59)
GFR calc non Af Amer: 100 mL/min/{1.73_m2} (ref >59)
Globulin, Total: 2.3 g/dL (ref 1.5–4.5)
Glucose: 85 mg/dL (ref 65–99)
Potassium: 4.5 mmol/L (ref 3.5–5.2)
Sodium: 132 mmol/L — ABNORMAL LOW (ref 134–144)
Total Protein: 6.8 g/dL (ref 6.0–8.5)

## 2019-08-30 LAB — LIPID PANEL
Chol/HDL Ratio: 1.9 ratio (ref 0.0–5.0)
Cholesterol, Total: 210 mg/dL — ABNORMAL HIGH (ref 100–199)
HDL: 113 mg/dL (ref >39)
LDL Chol Calc (NIH): 85 mg/dL (ref 0–99)
Triglycerides: 65 mg/dL (ref 0–149)
VLDL Cholesterol Cal: 12 mg/dL (ref 5–40)

## 2019-09-02 ENCOUNTER — Other Ambulatory Visit: Payer: Self-pay

## 2019-09-02 ENCOUNTER — Telehealth: Payer: Self-pay

## 2019-09-02 ENCOUNTER — Telehealth: Payer: Self-pay | Admitting: Family Medicine

## 2019-09-02 DIAGNOSIS — R600 Localized edema: Secondary | ICD-10-CM

## 2019-09-02 DIAGNOSIS — R609 Edema, unspecified: Secondary | ICD-10-CM

## 2019-09-02 DIAGNOSIS — I10 Essential (primary) hypertension: Secondary | ICD-10-CM

## 2019-09-02 NOTE — Telephone Encounter (Signed)
Please put in order unable to answer some of the questions. [place]

## 2019-09-02 NOTE — Telephone Encounter (Signed)
Need insurance info to complete auth for echo. [place]

## 2019-09-02 NOTE — Telephone Encounter (Signed)
Pt called and states that he continues to have issues with swelling. He states he has stopped drinking beer all week. He states that at night the swelling in his feet goes down but almost immediately swelling goes back up as soon as he gets out of bed. Please advise pt  [phone].

## 2019-09-02 NOTE — Progress Notes (Signed)
Error

## 2019-09-02 NOTE — Telephone Encounter (Signed)
Set him up for an echocardiogram

## 2019-09-04 NOTE — Telephone Encounter (Signed)
done

## 2019-09-06 ENCOUNTER — Telehealth: Payer: Self-pay

## 2019-09-06 ENCOUNTER — Telehealth: Payer: Self-pay | Admitting: General Practice

## 2019-09-06 MED ORDER — FUROSEMIDE 20 MG PO TABS: 20.0000 mg | ORAL_TABLET | Freq: Every day | ORAL | 3 refills | Status: DC

## 2019-09-06 NOTE — Telephone Encounter (Signed)
Pt has no insurance as of now and will go on medicare 10-18-19. Pt has taken laxis and this caused the swelling to go down. Please advise If he could get a script for this or if he will need an appt. Also please advise if he can wait till June to get the echo. [place]

## 2019-09-06 NOTE — Telephone Encounter (Signed)
° °  Went to pt chart, checking who called him. He wanted to speak with Edmon Crape regarding prior auth of his echo. transferred call

## 2019-09-06 NOTE — Telephone Encounter (Signed)
I called in for daily Lasix.  He needs an echo at some point so if he wants to wait that his call

## 2019-09-06 NOTE — Telephone Encounter (Signed)
LVM advising pt of med being sent  in and Dr. Redmond School is advising that it is up to him concerning the echo. [place]

## 2019-09-21 ENCOUNTER — Other Ambulatory Visit: Payer: Self-pay | Admitting: Family Medicine

## 2019-09-21 DIAGNOSIS — I10 Essential (primary) hypertension: Secondary | ICD-10-CM

## 2019-10-06 ENCOUNTER — Other Ambulatory Visit: Payer: Self-pay | Admitting: Psychiatry

## 2019-10-06 DIAGNOSIS — F341 Dysthymic disorder: Secondary | ICD-10-CM

## 2019-10-06 NOTE — Telephone Encounter (Signed)
Last apt 09/2018 

## 2019-10-21 ENCOUNTER — Ambulatory Visit (HOSPITAL_COMMUNITY): Payer: Medicare Other | Attending: Cardiology

## 2019-10-21 ENCOUNTER — Other Ambulatory Visit: Payer: Self-pay

## 2019-10-21 DIAGNOSIS — I10 Essential (primary) hypertension: Secondary | ICD-10-CM | POA: Diagnosis not present

## 2019-10-21 DIAGNOSIS — F17200 Nicotine dependence, unspecified, uncomplicated: Secondary | ICD-10-CM | POA: Insufficient documentation

## 2019-10-21 DIAGNOSIS — I712 Thoracic aortic aneurysm, without rupture: Secondary | ICD-10-CM | POA: Insufficient documentation

## 2019-10-21 DIAGNOSIS — R9431 Abnormal electrocardiogram [ECG] [EKG]: Secondary | ICD-10-CM | POA: Diagnosis not present

## 2019-10-21 DIAGNOSIS — I517 Cardiomegaly: Secondary | ICD-10-CM | POA: Insufficient documentation

## 2019-10-21 DIAGNOSIS — R609 Edema, unspecified: Secondary | ICD-10-CM | POA: Diagnosis not present

## 2019-10-21 DIAGNOSIS — R600 Localized edema: Secondary | ICD-10-CM

## 2019-10-21 DIAGNOSIS — I082 Rheumatic disorders of both aortic and tricuspid valves: Secondary | ICD-10-CM | POA: Insufficient documentation

## 2019-10-26 ENCOUNTER — Other Ambulatory Visit: Payer: Self-pay

## 2019-10-26 ENCOUNTER — Telehealth: Payer: Self-pay | Admitting: Psychiatry

## 2019-10-26 DIAGNOSIS — F411 Generalized anxiety disorder: Secondary | ICD-10-CM

## 2019-10-26 DIAGNOSIS — F410 Panic disorder [episodic paroxysmal anxiety] without agoraphobia: Secondary | ICD-10-CM

## 2019-10-26 MED ORDER — HYDROXYZINE HCL 10 MG PO TABS: ORAL_TABLET | ORAL | 0 refills | Status: DC

## 2019-10-26 NOTE — Telephone Encounter (Signed)
Rx sent 

## 2019-10-26 NOTE — Telephone Encounter (Signed)
Patient called and made an appt for 6/21. He needs a refill on his hydroxyzine 10 mg 2 tabs evry 4 hrs prn. Please send to [facility]

## 2019-11-02 ENCOUNTER — Telehealth: Payer: Self-pay

## 2019-11-02 MED ORDER — AMLODIPINE BESYLATE 5 MG PO TABS: 5.0000 mg | ORAL_TABLET | Freq: Every day | ORAL | 0 refills | Status: DC

## 2019-11-02 NOTE — Telephone Encounter (Signed)
Done

## 2019-11-02 NOTE — Telephone Encounter (Signed)
Received fax from [facility]. [facility]. For a refill on the pts. Amlodipine pt. Last apt. Was 08/29/19.

## 2019-11-07 ENCOUNTER — Encounter: Payer: Self-pay | Admitting: Psychiatry

## 2019-11-07 ENCOUNTER — Other Ambulatory Visit: Payer: Self-pay | Admitting: Psychiatry

## 2019-11-07 ENCOUNTER — Other Ambulatory Visit: Payer: Self-pay

## 2019-11-07 ENCOUNTER — Ambulatory Visit (INDEPENDENT_AMBULATORY_CARE_PROVIDER_SITE_OTHER): Payer: Medicare Other | Admitting: Psychiatry

## 2019-11-07 DIAGNOSIS — F410 Panic disorder [episodic paroxysmal anxiety] without agoraphobia: Secondary | ICD-10-CM | POA: Diagnosis not present

## 2019-11-07 DIAGNOSIS — F411 Generalized anxiety disorder: Secondary | ICD-10-CM | POA: Diagnosis not present

## 2019-11-07 DIAGNOSIS — F341 Dysthymic disorder: Secondary | ICD-10-CM | POA: Diagnosis not present

## 2019-11-07 MED ORDER — HYDROXYZINE HCL 10 MG PO TABS: ORAL_TABLET | ORAL | 3 refills | Status: DC

## 2019-11-07 MED ORDER — ESCITALOPRAM OXALATE 20 MG PO TABS: 20.0000 mg | ORAL_TABLET | Freq: Every day | ORAL | 3 refills | Status: DC

## 2019-11-07 MED ORDER — ARIPIPRAZOLE 5 MG PO TABS: 5.0000 mg | ORAL_TABLET | Freq: Every day | ORAL | 3 refills | Status: DC

## 2019-11-07 NOTE — Progress Notes (Signed)
   11/07/19 0827  Facial and Oral Movements  Muscles of Facial Expression 0  Lips and Perioral Area 0  Jaw 0  Tongue 0  Extremity Movements  Upper (arms, wrists, hands, fingers) 0  Lower (legs, knees, ankles, toes) 0  Trunk Movements  Neck, shoulders, hips 0  Overall Severity  Severity of abnormal movements (highest score from questions above) 0  Incapacitation due to abnormal movements 0  Patient's awareness of abnormal movements (rate only patient's report) 0  AIMS Total Score  AIMS Total Score 0

## 2019-11-07 NOTE — Progress Notes (Signed)
Nathaniel Forts Horsey [id number] 10-16-1955 64 y.o.  Subjective:   Patient ID:  Nathaniel Pena is a 64 y.o. (DOB 23-Aug-1955) male.  Chief Complaint:  Chief Complaint  Patient presents with   Follow-up    h/o depression, anxiety, and insomnia    HPI Nathaniel Pena presents to the office today for follow-up of depression, anxiety, and insomnia. He reports that he had a lapse in insurance. He reports that he stopped Abilify at that time due to cost and noticed. He noticed worsening anxiety and depression off Abilify and improved when he resumed Abilify. He reports that he notices some mild anxiety and depression "in the background" currently. He denies any panic s/s. He notices some occ worry and feeling nervous. Depression has been manageable. Sleeping ok. He reports that he takes an afternoon nap. Appetite has been good. He reports energy is good until the afternoon when he "hits a wall." He reports motivation is at baseline. He reports that he does what he needs to do. Denies anhedonia. Adequate concentration. Denies SI.  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 Son is living with him.   Reports that he takes Remeron as needed for insomnia.    AIMS     Office Visit from 11/07/2019 in [facility] Total Score 0    PHQ2-9     Clinical Support from 11/21/2015 in [facility] Visit from 03/26/2012 in [facility]  PHQ-2 Total Score 6 0       Review of Systems:  Review of Systems  Cardiovascular:       Reports that he had some LE edema and had echocardiogram. Reports medical w/u was WNL.   Musculoskeletal: Negative for gait problem.  Neurological: Negative for tremors.  Psychiatric/Behavioral:       Please refer to HPI    Medications: I have reviewed the patient's current medications.  Current Outpatient Medications  Medication Sig Dispense Refill   amLODipine (NORVASC) 5 MG tablet Take 1 tablet (5 mg  total) by mouth daily. 90 tablet 0   ARIPiprazole (ABILIFY) 5 MG tablet Take 1 tablet (5 mg total) by mouth daily. 90 tablet 3   aspirin 81 MG chewable tablet Chew 1 tablet (81 mg total) by mouth every 6 (six) hours as needed for mild pain. 90 tablet 3   escitalopram (LEXAPRO) 20 MG tablet Take 1 tablet (20 mg total) by mouth daily. 90 tablet 3   furosemide (LASIX) 20 MG tablet Take 1 tablet (20 mg total) by mouth daily. 30 tablet 3   hydrOXYzine (ATARAX/VISTARIL) 10 MG tablet Take 1-2 tabs po q 4 hours prn anxiety 420 tablet 3   lisinopril-hydrochlorothiazide (ZESTORETIC) 20-12.5 MG tablet TAKE 1 TABLET BY MOUTH DAILY. 90 tablet 0   pantoprazole (PROTONIX) 40 MG tablet TAKE 1 TABLET BY MOUTH DAILY. 90 tablet 1   Avanafil (STENDRA) 200 MG TABS Take 1 tablet by mouth daily as needed. 10 tablet 0   mirtazapine (REMERON) 15 MG tablet Take 1 tablet (15 mg total) by mouth at bedtime as needed. (Patient not taking: Reported on 02/17/2019) 90 tablet 2   No current facility-administered medications for this visit.    Medication Side Effects: None  Allergies: No Known Allergies  Past Medical History:  Diagnosis Date   Colonic polyp    GAD (generalized anxiety disorder)    GERD (gastroesophageal reflux disease)    Hypertension    Seizures ([place])  Tobacco abuse    smoker for 25 years. quit in 2002    Family History  Problem Relation Age of Onset   Anxiety disorder Mother    Depression Mother    Hypertension Mother    Hypertension Brother    Depression Brother    Colon cancer Paternal Grandfather    Anxiety disorder Daughter    ADD / ADHD Daughter    ADD / ADHD Son    Anxiety disorder Son     Social History   Socioeconomic History   Marital status: Married    Spouse name: Not on file   Number of children: Not on file   Years of education: Not on file   Highest education level: Not on file  Occupational History   Not on file  Tobacco Use    Smoking status: Former Smoker    Packs/day: 1.00    Quit date: 05/20/2019    Years since quitting: 0.4   Smokeless tobacco: Former Systems developer    Types: Snuff  Substance and Sexual Activity   Alcohol use: Yes    Alcohol/week: 54.0 standard drinks    Types: 42 Cans of beer, 12 Standard drinks or equivalent per week   Drug use: No   Sexual activity: Yes  Other Topics Concern   Not on file  Social History Narrative   Not on file   Social Determinants of Health   Financial Resource Strain:    Difficulty of Paying Living Expenses:   Food Insecurity:    Worried About Charity fundraiser in the Last Year:    Arboriculturist in the Last Year:   Transportation Needs:    Film/video editor (Medical):    Lack of Transportation (Non-Medical):   Physical Activity:    Days of Exercise per Week:    Minutes of Exercise per Session:   Stress:    Feeling of Stress :   Social Connections:    Frequency of Communication with Friends and Family:    Frequency of Social Gatherings with Friends and Family:    Attends Religious Services:    Active Member of Clubs or Organizations:    Attends Music therapist:    Marital Status:   Intimate Partner Violence:    Fear of Current or Ex-Partner:    Emotionally Abused:    Physically Abused:    Sexually Abused:     Past Medical History, Surgical history, Social history, and Family history were reviewed and updated as appropriate.   Please see review of systems for further details on the patient's review from today.   Objective:   Physical Exam:  BP (!) 152/90    Pulse 72    Wt 250 lb (113.4 kg)    BMI 30.43 kg/m   Physical Exam Constitutional:      General: He is not in acute distress. Musculoskeletal:        General: No deformity.  Neurological:     Mental Status: He is alert and oriented to person, place, and time.     Coordination: Coordination normal.  Psychiatric:        Attention and Perception:  Attention and perception normal. He does not perceive auditory or visual hallucinations.        Mood and Affect: Mood normal. Mood is not anxious or depressed. Affect is not labile, blunt, angry or inappropriate.        Speech: Speech normal.        Behavior: Behavior  normal.        Thought Content: Thought content normal. Thought content is not paranoid or delusional. Thought content does not include homicidal or suicidal ideation. Thought content does not include homicidal or suicidal plan.        Cognition and Memory: Cognition and memory normal.        Judgment: Judgment normal.     Comments: Insight intact     Lab Review:     Component Value Date/Time   NA 132 (L) 08/29/2019 1000   K 4.5 08/29/2019 1000   CL 92 (L) 08/29/2019 1000   CO2 25 08/29/2019 1000   GLUCOSE 85 08/29/2019 1000   GLUCOSE 82 06/03/2016 1211   BUN 6 (L) 08/29/2019 1000   CREATININE 0.69 (L) 08/29/2019 1000   CREATININE 0.66 (L) 06/03/2016 1211   CALCIUM 9.2 08/29/2019 1000   PROT 6.8 08/29/2019 1000   ALBUMIN 4.5 08/29/2019 1000   AST 43 (H) 08/29/2019 1000   ALT 43 08/29/2019 1000   ALKPHOS 75 08/29/2019 1000   BILITOT 0.3 08/29/2019 1000   GFRNONAA 100 08/29/2019 1000   GFRAA 116 08/29/2019 1000       Component Value Date/Time   WBC 6.0 08/29/2019 1000   WBC 6.4 06/03/2016 1211   RBC 4.39 08/29/2019 1000   RBC 4.52 06/03/2016 1211   HGB 14.6 08/29/2019 1000   HCT 41.6 08/29/2019 1000   PLT 220 08/29/2019 1000   MCV 95 08/29/2019 1000   MCH 33.3 (H) 08/29/2019 1000   MCH 33.8 (H) 06/03/2016 1211   MCHC 35.1 08/29/2019 1000   MCHC 34.6 06/03/2016 1211   RDW 12.6 08/29/2019 1000   LYMPHSABS 2.0 08/29/2019 1000   MONOABS 1,024 (H) 06/03/2016 1211   EOSABS 0.3 08/29/2019 1000   BASOSABS 0.1 08/29/2019 1000    No results found for: POCLITH, LITHIUM   No results found for: PHENYTOIN, PHENOBARB, VALPROATE, CBMZ   .res Assessment: Plan:   Will continue current plan of care since target  signs and symptoms are well controlled without any tolerability issues. Continue Abilify 5 mg daily for depression. Continue Lexapro 20 mg daily for anxiety and depression. Continue hydroxyzine as needed for anxiety and insomnia. Patient to follow-up in 1 year or sooner if clinically indicated. Patient advised to contact office with any questions, adverse effects, or acute worsening in signs and symptoms.  Nathaniel Pena was seen today for follow-up.  Diagnoses and all orders for this visit:  Generalized anxiety disorder -     hydrOXYzine (ATARAX/VISTARIL) 10 MG tablet; Take 1-2 tabs po q 4 hours prn anxiety -     escitalopram (LEXAPRO) 20 MG tablet; Take 1 tablet (20 mg total) by mouth daily.  Panic disorder -     hydrOXYzine (ATARAX/VISTARIL) 10 MG tablet; Take 1-2 tabs po q 4 hours prn anxiety -     escitalopram (LEXAPRO) 20 MG tablet; Take 1 tablet (20 mg total) by mouth daily.  Dysthymic disorder -     ARIPiprazole (ABILIFY) 5 MG tablet; Take 1 tablet (5 mg total) by mouth daily. -     escitalopram (LEXAPRO) 20 MG tablet; Take 1 tablet (20 mg total) by mouth daily.     Please see After Visit Summary for patient specific instructions.  Future Appointments  Date Time Provider [place]  11/06/2020  8:30 AM Thayer Headings, PMHNP CP-CP None    No orders of the defined types were placed in this encounter.   -------------------------------

## 2019-12-22 ENCOUNTER — Other Ambulatory Visit: Payer: Self-pay | Admitting: Family Medicine

## 2019-12-22 DIAGNOSIS — I10 Essential (primary) hypertension: Secondary | ICD-10-CM

## 2020-02-03 ENCOUNTER — Other Ambulatory Visit: Payer: Self-pay | Admitting: Family Medicine

## 2020-02-03 DIAGNOSIS — K219 Gastro-esophageal reflux disease without esophagitis: Secondary | ICD-10-CM

## 2020-02-10 ENCOUNTER — Other Ambulatory Visit: Payer: Self-pay | Admitting: Family Medicine

## 2020-02-21 ENCOUNTER — Other Ambulatory Visit: Payer: Self-pay

## 2020-02-21 ENCOUNTER — Ambulatory Visit (INDEPENDENT_AMBULATORY_CARE_PROVIDER_SITE_OTHER): Payer: Medicare Other | Admitting: Family Medicine

## 2020-02-21 VITALS — BP 124/88 | HR 76 | Temp 97.1°F

## 2020-02-21 DIAGNOSIS — F411 Generalized anxiety disorder: Secondary | ICD-10-CM

## 2020-02-21 DIAGNOSIS — F1010 Alcohol abuse, uncomplicated: Secondary | ICD-10-CM

## 2020-02-21 DIAGNOSIS — R29818 Other symptoms and signs involving the nervous system: Secondary | ICD-10-CM

## 2020-02-21 DIAGNOSIS — Z136 Encounter for screening for cardiovascular disorders: Secondary | ICD-10-CM

## 2020-02-21 DIAGNOSIS — Z87891 Personal history of nicotine dependence: Secondary | ICD-10-CM

## 2020-02-21 DIAGNOSIS — Z23 Encounter for immunization: Secondary | ICD-10-CM

## 2020-02-21 DIAGNOSIS — I10 Essential (primary) hypertension: Secondary | ICD-10-CM | POA: Diagnosis not present

## 2020-02-21 DIAGNOSIS — N529 Male erectile dysfunction, unspecified: Secondary | ICD-10-CM

## 2020-02-21 NOTE — Progress Notes (Signed)
Nathaniel Pena is a 64 y.o. male who presents for his welcome to Medicare visit and follow-up on chronic medical conditions.  He is involved in counseling and on Abilify as well as Lexapro and does find this quite useful. He unfortunately does continue to drink in spite of all of his medical personnel advising him to quit. He drinks roughly 6 beverages per day. His reflux seems to be under good control with Protonix. There is a question of OSA but he is not interested in pursuing this any further. He is a former smoker. He has also had some difficulty with erectile dysfunction and is using injections to help with this.  Immunizations and Health Maintenance Immunization History  Administered Date(s) Administered  . Influenza Split 01/30/2011, 02/24/2012, 02/16/2013  . Influenza,inj,Quad PF,6+ Mos 01/12/2018, 02/17/2019  . Tdap 03/26/2012  . Zoster Recombinat (Shingrix) 04/06/2017, 06/10/2017   Health Maintenance Due  Topic Date Due  . COVID-19 Vaccine (1) Never done  . HIV Screening  Never done  . PNA vac Low Risk Adult (1 of 2 - PCV13) Never done  . INFLUENZA VACCINE  12/18/2019    Last colonoscopy: 06/01/12 Last PSA: unknown Dentist:Q year Ophtho:Q two years Exercise: walking 30 mi a day  Other doctors caring for patient include: Loni Beckwith  Advanced Directives: Does Patient Have a Medical Advance Directive?: No Would patient like information on creating a medical advance directive?: Yes (ED - Information included in AVS)  Depression screen:  See questionnaire below.     Depression screen [facility] 2/9 02/21/2020 11/21/2015 03/26/2012  Decreased Interest 0 3 0  Down, Depressed, Hopeless 0 3 0  PHQ - 2 Score 0 6 0    Fall Screen: See Questionaire below.   Fall Risk  02/21/2020 11/21/2015  Falls in the past year? 0 No    ADL screen:  See questionnaire below.  Functional Status Survey: Is the patient deaf or have difficulty hearing?: No Does the patient have difficulty seeing,  even when wearing glasses/contacts?: No Does the patient have difficulty concentrating, remembering, or making decisions?: No Does the patient have difficulty walking or climbing stairs?: No Does the patient have difficulty dressing or bathing?: No Does the patient have difficulty doing errands alone such as visiting a doctor's office or shopping?: No   Review of Systems  Constitutional: -, -unexpected weight change, -anorexia, -fatigue Allergy: -sneezing, -itching, -congestion Dermatology: denies changing moles, rash, lumps ENT: -runny nose, -ear pain, -sore throat,  Cardiology:  -chest pain, -palpitations, -orthopnea, Respiratory: -cough, -shortness of breath, -dyspnea on exertion, -wheezing,  Gastroenterology: -abdominal pain, -nausea, -vomiting, -diarrhea, -constipation, -dysphagia Hematology: -bleeding or bruising problems Musculoskeletal: -arthralgias, -myalgias, -joint swelling, -back pain, - Ophthalmology: -vision changes,  Urology: -dysuria, -difficulty urinating,  -urinary frequency, -urgency, incontinence Neurology: -, -numbness, , -memory loss, -falls, -dizziness    PHYSICAL EXAM:  General Appearance: Alert, cooperative, no distress, appears stated age Head: Normocephalic, without obvious abnormality, atraumatic Eyes: PERRL, conjunctiva/corneas clear, EOM's intact, Ears: Normal TM's and external ear canals Nose: Nares normal, mucosa normal, no drainage or sinus   tenderness Throat: Lips, mucosa, and tongue normal; teeth and gums normal Neck: Supple, no lymphadenopathy, thyroid:no enlargement/tenderness/nodules; no carotid bruit or JVD Lungs: Clear to auscultation bilaterally without wheezes, rales or ronchi; respirations unlabored Heart: Regular rate and rhythm, S1 and S2 normal, no murmur, rub or gallop Abdomen: Soft, non-tender, nondistended, normoactive bowel sounds, no masses, no hepatosplenomegaly Extremities: No clubbing, cyanosis or edema Pulses: 2+ and  symmetric all extremities Skin:  Skin color, texture, turgor normal, no rashes or lesions Lymph nodes: Cervical, supraclavicular, and axillary nodes normal Neurologic: CNII-XII intact, normal strength, sensation and gait; reflexes 2+ and symmetric throughout   Psych: Normal mood, affect, hygiene and grooming  ASSESSMENT/PLAN: GAD (generalized anxiety disorder)  Immunization, viral disease - Plan: Pfizer SARS-COV-2 Vaccine  Need for influenza vaccination - Plan: Flu Vaccine QUAD High Dose(Fluad), CANCELED: Flu Vaccine QUAD High Dose(Fluad)  Essential hypertension  Alcohol abuse  Former smoker  Erectile dysfunction, unspecified erectile dysfunction type  Suspected sleep apnea  Screening for AAA (abdominal aortic aneurysm) - Plan: US AORTA  He is to continue on his present medications. I again discussed the need for him to quit drinking to help with his overall health including reflux as well as erectile dysfunction.    healthy diet and alcohol recommendations (less than or equal to 2 drinks/day) reviewed; Marland Kitchen Immunization recommendations discussed.  Colonoscopy recommendations reviewed.   Medicare Attestation I have personally reviewed: The patient's medical and social history Their use of alcohol, tobacco or illicit drugs Their current medications and supplements The patient's functional ability including ADLs,fall risks, home safety risks, cognitive, and hearing and visual impairment Diet and physical activities Evidence for depression or mood disorders  The patient's weight, height, and BMI have been recorded in the chart.  I have made referrals, counseling, and provided education to the patient based on review of the above and I have provided the patient with a written personalized care plan for preventive services.     Jill Alexanders, MD   02/21/2020    Nathaniel Pena is a 64 y.o. male who presents for annual wellness visit and follow-up on chronic medical conditions.  He  has the following concerns:   Immunizations and Health Maintenance Immunization History  Administered Date(s) Administered  . Influenza Split 01/30/2011, 02/24/2012, 02/16/2013  . Influenza,inj,Quad PF,6+ Mos 01/12/2018, 02/17/2019  . Tdap 03/26/2012  . Zoster Recombinat (Shingrix) 04/06/2017, 06/10/2017   Health Maintenance Due  Topic Date Due  . COVID-19 Vaccine (1) Never done  . HIV Screening  Never done  . PNA vac Low Risk Adult (1 of 2 - PCV13) Never done  . INFLUENZA VACCINE  12/18/2019    Last colonoscopy: Last PSA: Dentist: Ophtho: Exercise:  Other doctors caring for patient include:  Advanced Directives: Does Patient Have a Medical Advance Directive?: No Would patient like information on creating a medical advance directive?: Yes (ED - Information included in AVS)  Depression screen:  See questionnaire below.     Depression screen [facility] 2/9 02/21/2020 11/21/2015 03/26/2012  Decreased Interest 0 3 0  Down, Depressed, Hopeless 0 3 0  PHQ - 2 Score 0 6 0    Fall Screen: See Questionaire below.   Fall Risk  02/21/2020 11/21/2015  Falls in the past year? 0 No    ADL screen:  See questionnaire below.  Functional Status Survey: Is the patient deaf or have difficulty hearing?: No Does the patient have difficulty seeing, even when wearing glasses/contacts?: No Does the patient have difficulty concentrating, remembering, or making decisions?: No Does the patient have difficulty walking or climbing stairs?: No Does the patient have difficulty dressing or bathing?: No Does the patient have difficulty doing errands alone such as visiting a doctor's office or shopping?: No   Review of Systems  Constitutional: -, -unexpected weight change, -anorexia, -fatigue Allergy: -sneezing, -itching, -congestion Dermatology: denies changing moles, rash, lumps ENT: -runny nose, -ear pain, -sore throat,  Cardiology:  -chest pain, -  palpitations, -orthopnea, Respiratory: -cough,  -shortness of breath, -dyspnea on exertion, -wheezing,  Gastroenterology: -abdominal pain, -nausea, -vomiting, -diarrhea, -constipation, -dysphagia Hematology: -bleeding or bruising problems Musculoskeletal: -arthralgias, -myalgias, -joint swelling, -back pain, - Ophthalmology: -vision changes,  Urology: -dysuria, -difficulty urinating,  -urinary frequency, -urgency, incontinence Neurology: -, -numbness, , -memory loss, -falls, -dizziness    PHYSICAL EXAM:  There were no vitals taken for this visit.  General Appearance: Alert, cooperative, no distress, appears stated age Head: Normocephalic, without obvious abnormality, atraumatic Eyes: PERRL, conjunctiva/corneas clear, EOM's intact, fundi benign Ears: Normal TM's and external ear canals Nose: Nares normal, mucosa normal, no drainage or sinus   tenderness Throat: Lips, mucosa, and tongue normal; teeth and gums normal Neck: Supple, no lymphadenopathy, thyroid:no enlargement/tenderness/nodules; no carotid bruit or JVD Lungs: Clear to auscultation bilaterally without wheezes, rales or ronchi; respirations unlabored Heart: Regular rate and rhythm, S1 and S2 normal, no murmur, rub or gallop Abdomen: Soft, non-tender, nondistended, normoactive bowel sounds, no masses, no hepatosplenomegaly Extremities: No clubbing, cyanosis or edema Pulses: 2+ and symmetric all extremities Skin: Skin color, texture, turgor normal, no rashes or lesions Lymph nodes: Cervical, supraclavicular, and axillary nodes normal Neurologic: CNII-XII intact, normal strength, sensation and gait; reflexes 2+ and symmetric throughout   Psych: Normal mood, affect, hygiene and grooming  ASSESSMENT/PLAN:    Discussed PSA screening (risks/benefits), recommended at least 30 minutes of aerobic activity at least 5 days/week; proper sunscreen use reviewed; healthy diet and alcohol recommendations (less than or equal to 2 drinks/day) reviewed; regular seatbelt use; changing  batteries in smoke detectors. Immunization recommendations discussed.  Colonoscopy recommendations reviewed.   Medicare Attestation I have personally reviewed: The patient's medical and social history Their use of alcohol, tobacco or illicit drugs Their current medications and supplements The patient's functional ability including ADLs,fall risks, home safety risks, cognitive, and hearing and visual impairment Diet and physical activities Evidence for depression or mood disorders  The patient's weight, height, and BMI have been recorded in the chart.  I have made referrals, counseling, and provided education to the patient based on review of the above and I have provided the patient with a written personalized care plan for preventive services.     Jill Alexanders, MD   02/21/2020

## 2020-03-01 ENCOUNTER — Ambulatory Visit
Admission: RE | Admit: 2020-03-01 | Discharge: 2020-03-01 | Disposition: A | Discharge status: Home / Self Care | Payer: Medicare Other | Source: Ambulatory Visit | Attending: Family Medicine | Admitting: Family Medicine

## 2020-03-01 DIAGNOSIS — Z136 Encounter for screening for cardiovascular disorders: Secondary | ICD-10-CM

## 2020-03-01 DIAGNOSIS — Z87891 Personal history of nicotine dependence: Secondary | ICD-10-CM | POA: Diagnosis not present

## 2020-03-29 ENCOUNTER — Other Ambulatory Visit: Payer: Self-pay | Admitting: Family Medicine

## 2020-03-29 DIAGNOSIS — I10 Essential (primary) hypertension: Secondary | ICD-10-CM

## 2020-04-18 ENCOUNTER — Other Ambulatory Visit: Payer: Self-pay | Admitting: Family Medicine

## 2020-05-09 ENCOUNTER — Other Ambulatory Visit: Payer: Self-pay | Admitting: Family Medicine

## 2020-06-20 ENCOUNTER — Other Ambulatory Visit: Payer: Self-pay | Admitting: Family Medicine

## 2020-07-02 ENCOUNTER — Other Ambulatory Visit: Payer: Self-pay | Admitting: Family Medicine

## 2020-07-02 DIAGNOSIS — I10 Essential (primary) hypertension: Secondary | ICD-10-CM

## 2020-08-14 ENCOUNTER — Other Ambulatory Visit: Payer: Self-pay | Admitting: Family Medicine

## 2020-08-27 ENCOUNTER — Other Ambulatory Visit: Payer: Self-pay | Admitting: Family Medicine

## 2020-08-27 ENCOUNTER — Other Ambulatory Visit (HOSPITAL_COMMUNITY): Payer: Self-pay

## 2020-08-27 MED ORDER — AMLODIPINE BESYLATE 5 MG PO TABS
5.0000 mg | ORAL_TABLET | Freq: Every day | ORAL | 0 refills | Status: DC
  Filled 2020-08-27: qty 90, 90d supply, fill #0

## 2020-08-27 MED FILL — Hydroxyzine HCl Tab 10 MG: ORAL | 35 days supply | Qty: 420 | Fill #0 | Status: AC

## 2020-08-28 ENCOUNTER — Other Ambulatory Visit (HOSPITAL_COMMUNITY): Payer: Self-pay

## 2020-09-04 ENCOUNTER — Other Ambulatory Visit (HOSPITAL_COMMUNITY): Payer: Self-pay

## 2020-09-04 MED FILL — Escitalopram Oxalate Tab 20 MG (Base Equiv): ORAL | 90 days supply | Qty: 90 | Fill #0 | Status: AC

## 2020-09-07 ENCOUNTER — Other Ambulatory Visit (HOSPITAL_COMMUNITY): Payer: Self-pay

## 2020-09-12 ENCOUNTER — Other Ambulatory Visit (HOSPITAL_COMMUNITY): Payer: Self-pay

## 2020-09-12 MED FILL — Aripiprazole Tab 5 MG: ORAL | 90 days supply | Qty: 90 | Fill #0 | Status: AC

## 2020-10-03 ENCOUNTER — Other Ambulatory Visit: Payer: Medicare Other

## 2020-10-03 ENCOUNTER — Other Ambulatory Visit: Payer: Self-pay

## 2020-10-03 NOTE — Progress Notes (Signed)
Pt was made an appointment due to him wanting to see Dr. Redmond School. Dr. Redmond School was out of the office.  Pt was also advised  if any thing changes please go to ER

## 2020-10-04 ENCOUNTER — Ambulatory Visit (INDEPENDENT_AMBULATORY_CARE_PROVIDER_SITE_OTHER): Payer: Medicare Other | Admitting: Family Medicine

## 2020-10-04 VITALS — BP 120/90 | HR 79 | Temp 96.8°F | Ht 75.0 in | Wt 218.6 lb

## 2020-10-04 DIAGNOSIS — F1010 Alcohol abuse, uncomplicated: Secondary | ICD-10-CM

## 2020-10-04 DIAGNOSIS — R42 Dizziness and giddiness: Secondary | ICD-10-CM | POA: Diagnosis not present

## 2020-10-04 DIAGNOSIS — S0083XA Contusion of other part of head, initial encounter: Secondary | ICD-10-CM | POA: Diagnosis not present
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
## 2020-10-05 ENCOUNTER — Other Ambulatory Visit: Payer: Self-pay | Admitting: Family Medicine

## 2020-10-05 ENCOUNTER — Other Ambulatory Visit (HOSPITAL_COMMUNITY): Payer: Self-pay

## 2020-10-05 DIAGNOSIS — I10 Essential (primary) hypertension: Secondary | ICD-10-CM

## 2020-10-05 MED ORDER — LISINOPRIL-HYDROCHLOROTHIAZIDE 20-12.5 MG PO TABS
1.0000 | ORAL_TABLET | Freq: Every day | ORAL | 0 refills | Status: DC
  Filled 2020-10-05 (×2): qty 90, 90d supply, fill #0

## 2020-10-06 ENCOUNTER — Other Ambulatory Visit (HOSPITAL_COMMUNITY): Payer: Self-pay

## 2020-11-06 ENCOUNTER — Other Ambulatory Visit (HOSPITAL_COMMUNITY): Payer: Self-pay

## 2020-11-06 ENCOUNTER — Encounter: Payer: Self-pay | Admitting: Psychiatry

## 2020-11-06 ENCOUNTER — Other Ambulatory Visit: Payer: Self-pay

## 2020-11-06 ENCOUNTER — Ambulatory Visit: Payer: Medicare Other | Admitting: Psychiatry

## 2020-11-06 DIAGNOSIS — F341 Dysthymic disorder: Secondary | ICD-10-CM | POA: Diagnosis not present

## 2020-11-06 DIAGNOSIS — F410 Panic disorder [episodic paroxysmal anxiety] without agoraphobia: Secondary | ICD-10-CM

## 2020-11-06 DIAGNOSIS — F411 Generalized anxiety disorder: Secondary | ICD-10-CM | POA: Diagnosis not present

## 2020-11-06 MED ORDER — HYDROXYZINE HCL 10 MG PO TABS
10.0000 mg | ORAL_TABLET | ORAL | 3 refills | Status: DC
  Filled 2020-11-06: qty 420, 35d supply, fill #0
  Filled 2021-08-29: qty 420, 35d supply, fill #1

## 2020-11-06 MED ORDER — ESCITALOPRAM OXALATE 20 MG PO TABS
20.0000 mg | ORAL_TABLET | Freq: Every day | ORAL | 3 refills | Status: DC
  Filled 2020-11-06 – 2020-12-06 (×2): qty 90, 90d supply, fill #0
  Filled 2021-03-06: qty 90, 90d supply, fill #1
  Filled 2021-06-03: qty 90, 90d supply, fill #2
  Filled 2021-09-02: qty 90, 90d supply, fill #3

## 2020-11-06 MED ORDER — ARIPIPRAZOLE 5 MG PO TABS
5.0000 mg | ORAL_TABLET | Freq: Every day | ORAL | 3 refills | Status: DC
  Filled 2020-11-06: qty 90, 90d supply, fill #0
  Filled 2020-12-13: qty 72, 72d supply, fill #0
  Filled 2020-12-13: qty 18, 18d supply, fill #0
  Filled 2021-03-06: qty 90, 90d supply, fill #1
  Filled 2021-06-13: qty 90, 90d supply, fill #2
  Filled 2021-09-13: qty 90, 90d supply, fill #3

## 2020-11-06 NOTE — Progress Notes (Signed)
   11/06/20 0847  Facial and Oral Movements  Muscles of Facial Expression 0  Lips and Perioral Area 0  Jaw 0  Tongue 0  Extremity Movements  Upper (arms, wrists, hands, fingers) 0  Lower (legs, knees, ankles, toes) 0  Trunk Movements  Neck, shoulders, hips 0  Overall Severity  Severity of abnormal movements (highest score from questions above) 0  Incapacitation due to abnormal movements 0  Patient's awareness of abnormal movements (rate only patient's report) 0  AIMS Total Score  AIMS Total Score 0

## 2020-11-06 NOTE — Progress Notes (Signed)
Nathaniel Pena [id number] 1956-02-11 65 y.o.  Subjective:   Patient ID:  Nathaniel Pena is a 65 y.o. (DOB [birth year]) male.  Chief Complaint:  Chief Complaint  Patient presents with   Follow-up    H/o depression, anxiety, sleep disturbance, ETOH misuse    HPI Nathaniel Forts Jabbour presents to the office today for follow-up of history of depression, anxiety, insomnia, and alcohol misuse. He reports that his mood is overall stable and about once a month will have a depressed day- "it's very manageable." He reports that he has occasional insomnia. He typically sleeps about 6 hours, fragmented. He takes a 30 minute-60 minute nap in the afternoon. He reports that his energy is adequate. He reports that he has some generalized anxiety- "just a feeling of fear." Denies panic s/s. Appetite has been good. Motivation has been ok. Concentration is adequate. Denies SI.   He has retired. He plans to help out at his old job. He reports losing interest in golf. He reports that he typically staying around the house, spending time with his dogs, and going to the pub. His son moved in with his girlfriend. He is now living alone. Remains separated. Children remain supportive.   He reports that he quit drinking beer. He reports having a cocktail "about 4 times a day." He reports feeling better in the mornings since he stopped drinking beer. He reports that he has reduced ETOH use and reports that he does not wish to stop drinking completely.   [place] Office Visit from 11/06/2020 in [facility] Office Visit from 11/07/2019 in [facility] Total Score 0 0      PHQ2-9    [place] Office Visit from 10/04/2020 in [facility] Visit from 02/21/2020 in [facility] from 11/21/2015 in [facility] Visit from 03/26/2012 in [facility]  PHQ-2 Total Score 0 0 6 0  PHQ-9 Total Score 3 --  -- --        Review of Systems:  Review of Systems  Cardiovascular:        Reports improved LE edema after starting Lasix and no longer drinking beer  Gastrointestinal: Negative.   Musculoskeletal:  Negative for gait problem.  Neurological:  Negative for tremors and headaches.  Psychiatric/Behavioral:         Please refer to HPI   Medications: I have reviewed the patient's current medications.  Current Outpatient Medications  Medication Sig Dispense Refill   amLODipine (NORVASC) 5 MG tablet Take 1 tablet (5 mg total) by mouth daily. 90 tablet 0   aspirin 81 MG chewable tablet Chew 1 tablet (81 mg total) by mouth every 6 (six) hours as needed for mild pain. 90 tablet 3   furosemide (LASIX) 20 MG tablet TAKE 1 TABLET BY MOUTH ONCE DAILY. 90 tablet 0   lisinopril-hydrochlorothiazide (ZESTORETIC) 20-12.5 MG tablet Take 1 tablet by mouth daily. 90 tablet 0   pantoprazole (PROTONIX) 40 MG tablet TAKE 1 TABLET BY MOUTH DAILY. 90 tablet 0   ARIPiprazole (ABILIFY) 5 MG tablet Take 1 tablet (5 mg total) by mouth daily. 90 tablet 3   Avanafil (STENDRA) 200 MG TABS Take 1 tablet by mouth daily as needed. (Patient not taking: Reported on 11/06/2020) 10 tablet 0   escitalopram (LEXAPRO) 20 MG tablet Take 1 tablet (20 mg total) by mouth daily. 90 tablet 3   hydrOXYzine (ATARAX/VISTARIL) 10 MG tablet Take 1-2  tablets (10-20 mg total) by mouth every 4 (four) hours as needed for anxiety 420 tablet 3   No current facility-administered medications for this visit.    Medication Side Effects: None  Allergies: No Known Allergies  Past Medical History:  Diagnosis Date   Colonic polyp    GAD (generalized anxiety disorder)    GERD (gastroesophageal reflux disease)    Hypertension    Seizures ([place])    Tobacco abuse    smoker for 25 years. quit in 2002    Past Medical History, Surgical history, Social history, and Family history were reviewed and updated as appropriate.   Please see review of  systems for further details on the patient's review from today.   Objective:   Physical Exam:  BP 138/86   Pulse 74   Physical Exam Constitutional:      General: He is not in acute distress. Musculoskeletal:        General: No deformity.  Neurological:     Mental Status: He is alert and oriented to person, place, and time.     Coordination: Coordination normal.  Psychiatric:        Attention and Perception: Attention and perception normal. He does not perceive auditory or visual hallucinations.        Mood and Affect: Mood normal. Mood is not anxious or depressed. Affect is not labile, blunt, angry or inappropriate.        Speech: Speech normal.        Behavior: Behavior normal.        Thought Content: Thought content normal. Thought content is not paranoid or delusional. Thought content does not include homicidal or suicidal ideation. Thought content does not include homicidal or suicidal plan.        Cognition and Memory: Cognition and memory normal.        Judgment: Judgment normal.     Comments: Insight intact    Lab Review:     Component Value Date/Time   NA 132 (L) 08/29/2019 1000   K 4.5 08/29/2019 1000   CL 92 (L) 08/29/2019 1000   CO2 25 08/29/2019 1000   GLUCOSE 85 08/29/2019 1000   GLUCOSE 82 06/03/2016 1211   BUN 6 (L) 08/29/2019 1000   CREATININE 0.69 (L) 08/29/2019 1000   CREATININE 0.66 (L) 06/03/2016 1211   CALCIUM 9.2 08/29/2019 1000   PROT 6.8 08/29/2019 1000   ALBUMIN 4.5 08/29/2019 1000   AST 43 (H) 08/29/2019 1000   ALT 43 08/29/2019 1000   ALKPHOS 75 08/29/2019 1000   BILITOT 0.3 08/29/2019 1000   GFRNONAA 100 08/29/2019 1000   GFRAA 116 08/29/2019 1000       Component Value Date/Time   WBC 6.0 08/29/2019 1000   WBC 6.4 06/03/2016 1211   RBC 4.39 08/29/2019 1000   RBC 4.52 06/03/2016 1211   HGB 14.6 08/29/2019 1000   HCT 41.6 08/29/2019 1000   PLT 220 08/29/2019 1000   MCV 95 08/29/2019 1000   MCH 33.3 (H) 08/29/2019 1000   MCH 33.8  (H) 06/03/2016 1211   MCHC 35.1 08/29/2019 1000   MCHC 34.6 06/03/2016 1211   RDW 12.6 08/29/2019 1000   LYMPHSABS 2.0 08/29/2019 1000   MONOABS 1,024 (H) 06/03/2016 1211   EOSABS 0.3 08/29/2019 1000   BASOSABS 0.1 08/29/2019 1000    No results found for: POCLITH, LITHIUM   No results found for: PHENYTOIN, PHENOBARB, VALPROATE, CBMZ   .res Assessment: Plan:   Patient seen for 30 minutes  and time spent counseling the patient regarding alcohol usage.  Patient reports that he has been gradually reducing his alcohol intake and does not wish to stop alcohol completely at this time.  Encouraged patient to continue to try to limit alcohol as much as possible to minimize overall health risks and since alcohol can have a negative effect on his mood and anxiety.  Patient reports that he prefers to reduce alcohol use on his own and declines any alcohol treatment at this time. Patient reports that he would like to continue current medications without changes since his mood, anxiety, and sleep are well controlled. Continue Abilify 5 mg daily for mood signs and symptoms. Continue Lexapro 20 mg daily for anxiety depression. Continue hydroxyzine as needed for anxiety. Patient to follow-up in 1 year or sooner if clinically indicated. Patient advised to contact office with any questions, adverse effects, or acute worsening in signs and symptoms.  Annie Main was seen today for follow-up.  Diagnoses and all orders for this visit:  Dysthymic disorder -     ARIPiprazole (ABILIFY) 5 MG tablet; Take 1 tablet (5 mg total) by mouth daily. -     escitalopram (LEXAPRO) 20 MG tablet; Take 1 tablet (20 mg total) by mouth daily.  Generalized anxiety disorder -     escitalopram (LEXAPRO) 20 MG tablet; Take 1 tablet (20 mg total) by mouth daily. -     hydrOXYzine (ATARAX/VISTARIL) 10 MG tablet; Take 1-2 tablets (10-20 mg total) by mouth every 4 (four) hours as needed for anxiety  Panic disorder -     escitalopram  (LEXAPRO) 20 MG tablet; Take 1 tablet (20 mg total) by mouth daily. -     hydrOXYzine (ATARAX/VISTARIL) 10 MG tablet; Take 1-2 tablets (10-20 mg total) by mouth every 4 (four) hours as needed for anxiety    Please see After Visit Summary for patient specific instructions.  Future Appointments  Date Time Provider [place]  04/04/2021  9:15 AM Denita Lung, MD [facility] [place]  11/06/2021  8:30 AM Thayer Headings, PMHNP [facility] None    No orders of the defined types were placed in this encounter.   -------------------------------

## 2020-11-07 ENCOUNTER — Other Ambulatory Visit: Payer: Self-pay | Admitting: Family Medicine

## 2020-11-07 ENCOUNTER — Other Ambulatory Visit (HOSPITAL_COMMUNITY): Payer: Self-pay

## 2020-11-07 MED ORDER — FUROSEMIDE 20 MG PO TABS
20.0000 mg | ORAL_TABLET | Freq: Every day | ORAL | 0 refills | Status: DC
  Filled 2020-11-07: qty 90, 90d supply, fill #0

## 2020-11-08 ENCOUNTER — Other Ambulatory Visit (HOSPITAL_COMMUNITY): Payer: Self-pay

## 2020-11-22 ENCOUNTER — Other Ambulatory Visit (HOSPITAL_COMMUNITY): Payer: Self-pay

## 2020-11-22 ENCOUNTER — Encounter: Payer: Self-pay | Admitting: Family Medicine

## 2020-11-22 ENCOUNTER — Ambulatory Visit (INDEPENDENT_AMBULATORY_CARE_PROVIDER_SITE_OTHER): Payer: Medicare Other | Admitting: Family Medicine

## 2020-11-22 ENCOUNTER — Other Ambulatory Visit: Payer: Self-pay

## 2020-11-22 VITALS — BP 130/92 | HR 84 | Temp 98.6°F | Wt 218.8 lb

## 2020-11-22 DIAGNOSIS — M7021 Olecranon bursitis, right elbow: Secondary | ICD-10-CM | POA: Diagnosis not present

## 2020-11-22 MED ORDER — MELOXICAM 15 MG PO TABS
15.0000 mg | ORAL_TABLET | Freq: Every day | ORAL | 0 refills | Status: DC
Start: 2020-11-22 — End: 2021-11-06
  Filled 2020-11-22: qty 15, 15d supply, fill #0

## 2020-11-22 NOTE — Patient Instructions (Signed)
Take meloxicam once daily with food . Cut the dose in half or stop entirely if it bothers your stomach. Don't use any ibuprofen/advil/motrin/aleve/naproxen/Goody or BC powder while taking the meloxicam. You MAY take tylenol if needed for pain.  Use compression to the elbow. If you develop any rapid increase in size of the fluid collection, any fever, redness or pain develops, then you will need to be re-evaluated right away (concern for infection).  Consider doing the following exercises: Fill in the blanks with: hold for 10-15 seconds, repeat 10 times, do twice daily.  Elbow Bursitis Rehab Ask your health care provider which exercises are safe for you. Do exercises exactly as told by your health care provider and adjust them as directed. It is normal to feel mild stretching, pulling, tightness, or discomfort as you do these exercises. Stop right away if you feel sudden pain or your pain gets worse. Do not begin these exercises until told by your health care provider. Stretching and range-of-motion exercises These exercises warm up your muscles and joints and improve the movement andflexibility of your elbow. The exercises also help to relieve pain and swelling. Elbow flexion, assisted Stand or sit with your left / right arm at your side. Use your other hand to gently push your left / right hand toward your shoulder (assisted) while bending your elbow (flexion). Hold this position for __________ seconds. Slowly return your left / right arm to the starting position. Repeat __________ times. Complete this exercise __________ times a day. Elbow extension, assisted Lie on your back in a comfortable position that allows you to relax your arm muscles. Place a folded towel under your left / right upper arm so that your elbow and shoulder are at the same height. Use your other arm to raise your left / right arm (assisted) until your elbow and hand do not rest on the bed or towel. Hold your left /  right arm out straight with your other hand supporting it. Let the weight of your hand straighten your elbow (extension). You should feel a stretch on the inside of your elbow. Keep your arm and chest muscles relaxed. If directed, add a small wrist weight or hand weight to increase the stretch. Hold this position for __________ seconds. Slowly release the stretch and return to the starting position. Repeat __________ times. Complete this exercise __________ times a day. Elbow flexion, active Stand or sit with your left / right elbow bent and your palm facing in, toward your body. Bend your elbow as far as you can using only your arm muscles (active flexion). Hold this position for __________ seconds. Slowly return to the starting position. Repeat __________ times. Complete this exercise __________ times a day. Elbow extension, active Stand or sit with your left / right elbow bent and your palm facing in, toward your body. Slowly straighten your elbow using only your arm muscles (active extension). Stop when you feel a gentle stretch at the front of your arm, or when your arm is straight. Hold this position for __________ seconds. Slowly return to the starting position. Repeat __________ times. Complete this exercise __________ times a day. Strengthening exercises These exercises build strength and endurance in your elbow. Endurance is theability to use your muscles for a long time, even after they get tired. Elbow flexion, isometric Stand or sit with your left / right arm at waist height. Your palm should face in, toward your body. Place your other hand on top of your left / right  forearm. Gently push down while you resist with your left / right arm (isometric flexion). Use about 50% effort with both arms. You may be instructed to use more and more effort with your arms each week. Try not to let your left / right arm move during the exercise. Hold this position for __________ seconds. Let  your muscles relax completely before you repeat the exercise. Repeat __________ times. Complete this exercise __________ times a day. Elbow extension, isometric  Stand or sit with your left / right arm at waist height. Your palm should face in, toward your body. Place your other hand on the bottom of your left / right forearm. Gently push up while you resist with your left / right arm (isometric extension). Use about 50% effort with both arms. You may be instructed to use more and more effort with your arms each week. Try not to let your left / right arm move during the exercise. Hold this position for __________ seconds. Let your muscles relax completely before you repeat the exercise. Repeat __________ times. Complete this exercise __________ times a day. Biceps curls Sit on a stable chair without armrests, or stand up. Hold a _________ weight in your left / right hand. Your palm should face out, away from your body, at the starting position. Bend your left / right elbow and move your hand up toward your shoulder. Keep your elbow at your side while you bend it. Slowly return to the starting position. Repeat __________ times. Complete this exercise __________ times a day. Triceps curls  Lie on your back. Hold a _________ weight in your left / right hand. Bend your left / right elbow to a 90-degree angle (right angle), so the weight is in front of your face, over your chest, and your elbow is pointed up to the ceiling. Straighten your elbow, raising your hand toward the ceiling. Use your other hand to support your left / right upper arm and to keep it still. Slowly return to the starting position. Repeat __________ times. Complete this exercise __________ times a day. This information is not intended to replace advice given to you by your health care provider. Make sure you discuss any questions you have with your healthcare provider. Document Revised: 08/26/2018 Document Reviewed:  05/26/2018 Elsevier Patient Education  [place].

## 2020-11-22 NOTE — Progress Notes (Signed)
Chief Complaint  Patient presents with   other    Fluid on rt. Elbow noticed yesterday, maybe a little warm to the touch, no pain   He first noted swelling at the R elbow yesterday. No known injury, trauma, or change in activity. Denies the swelling being painful or any elbow pain.  30 years ago he had an infected bursa, recalls being treated with IV antibiotics.  That was related to trauma, and recalls it being very painful.   PMH, PSH, SH reviewed   Last Cr normal 08/2019  Outpatient Encounter Medications as of 11/22/2020  Medication Sig Note   amLODipine (NORVASC) 5 MG tablet Take 1 tablet (5 mg total) by mouth daily.    ARIPiprazole (ABILIFY) 5 MG tablet Take 1 tablet (5 mg total) by mouth daily.    aspirin 81 MG chewable tablet Chew 1 tablet (81 mg total) by mouth every 6 (six) hours as needed for mild pain.    escitalopram (LEXAPRO) 20 MG tablet Take 1 tablet (20 mg total) by mouth daily.    furosemide (LASIX) 20 MG tablet Take 1 tablet (20 mg total) by mouth daily.    hydrOXYzine (ATARAX/VISTARIL) 10 MG tablet Take 1-2 tablets (10-20 mg total) by mouth every 4 (four) hours as needed for anxiety    lisinopril-hydrochlorothiazide (ZESTORETIC) 20-12.5 MG tablet Take 1 tablet by mouth daily.    pantoprazole (PROTONIX) 40 MG tablet TAKE 1 TABLET BY MOUTH DAILY. (Patient not taking: Reported on 11/22/2020) 11/22/2020: prn   [DISCONTINUED] Avanafil (STENDRA) 200 MG TABS Take 1 tablet by mouth daily as needed. (Patient not taking: Reported on 11/06/2020)    No facility-administered encounter medications on file as of 11/22/2020.   No Known Allergies  ROS: No f/c/n/v/d. No chest pain, shortness of breath, URI symptoms. No GI complaints.   PHYSICAL EXAM:  BP (!) 130/92   Pulse 84   Temp 98.6 F (37 C)   Wt 218 lb 12.8 oz (99.2 kg)   BMI 27.35 kg/m   Pleasant, well-appearing male in no distress RUE: Soft fluid collection at R olecranon bursa (not tense).  Some dry skin at both  elbows, but skin is intact. No erythema, warmth.  Nontender Normal strength, sensation.    ASSESSMENT/PLAN:  Olecranon bursitis of right elbow - Plan: meloxicam (MOBIC) 15 MG tablet  No evidence of infection. Reviewed s/sx for which he should seek f/u care. Discussed use of Meloxicam (risks/SE/precautions) and use of compression.    Take meloxicam once daily with food . Cut the dose in half or stop entirely if it bothers your stomach. Don't use any ibuprofen/advil/motrin/aleve/naproxen/Goody or BC powder while taking the meloxicam. You MAY take tylenol if needed for pain.  Use compression to the elbow. If you develop any rapid increase in size of the fluid collection, any fever, redness or pain develops, then you will need to be re-evaluated right away (concern for infection).

## 2020-11-27 ENCOUNTER — Other Ambulatory Visit (HOSPITAL_COMMUNITY): Payer: Self-pay

## 2020-11-27 ENCOUNTER — Other Ambulatory Visit: Payer: Self-pay | Admitting: Family Medicine

## 2020-11-28 ENCOUNTER — Telehealth: Payer: Self-pay

## 2020-11-28 ENCOUNTER — Other Ambulatory Visit (HOSPITAL_COMMUNITY): Payer: Self-pay

## 2020-11-28 MED ORDER — AMLODIPINE BESYLATE 5 MG PO TABS
5.0000 mg | ORAL_TABLET | Freq: Every day | ORAL | 0 refills | Status: DC
  Filled 2020-11-28: qty 90, 90d supply, fill #0

## 2020-11-28 NOTE — Telephone Encounter (Signed)
Pt. Called stating he saw you last week for bursitis of rt. Elbow. He said you told him to call back or go to the ER if it started getting red around that area. He said its not red but it has some bruising about 8 in long from his elbow going down his arm. He wanted to know if you need to see it again or if he needed to go to the ER or if that was normal.

## 2020-11-28 NOTE — Telephone Encounter (Signed)
Pt. Aware of your recommendations and said that it is now not looking as bad as it was this morning. He thinks he may be on the road to recovery now. He stated he will go to the ER if it gets red or he has a fever.

## 2020-11-28 NOTE — Telephone Encounter (Signed)
Advise--it is not "normal" to see bruising.  There wasn't any bruising when I first evaluated him. He is taking an anti-inflammatory, which can cause increased bruising with any mild trauma.  So, if he has banged the area, he may see bruising there (and elsewhere on his body)  If he has fever, redness, increased swelling or pain, should go to ER.  If not having those things, can be evaluated back in our office (by anyone)

## 2020-11-29 ENCOUNTER — Other Ambulatory Visit (HOSPITAL_COMMUNITY): Payer: Self-pay

## 2020-12-06 ENCOUNTER — Other Ambulatory Visit (HOSPITAL_COMMUNITY): Payer: Self-pay

## 2020-12-13 ENCOUNTER — Other Ambulatory Visit (HOSPITAL_COMMUNITY): Payer: Self-pay

## 2020-12-31 ENCOUNTER — Other Ambulatory Visit (HOSPITAL_COMMUNITY): Payer: Self-pay

## 2020-12-31 ENCOUNTER — Other Ambulatory Visit: Payer: Self-pay | Admitting: Family Medicine

## 2020-12-31 DIAGNOSIS — I10 Essential (primary) hypertension: Secondary | ICD-10-CM

## 2020-12-31 MED ORDER — LISINOPRIL-HYDROCHLOROTHIAZIDE 20-12.5 MG PO TABS
1.0000 | ORAL_TABLET | Freq: Every day | ORAL | 0 refills | Status: DC
  Filled 2020-12-31: qty 90, 90d supply, fill #0

## 2021-01-16 ENCOUNTER — Other Ambulatory Visit: Payer: Self-pay | Admitting: Family Medicine

## 2021-01-16 ENCOUNTER — Other Ambulatory Visit (HOSPITAL_COMMUNITY): Payer: Self-pay

## 2021-01-16 MED ORDER — FUROSEMIDE 20 MG PO TABS
20.0000 mg | ORAL_TABLET | Freq: Every day | ORAL | 0 refills | Status: DC
  Filled 2021-01-16: qty 90, 90d supply, fill #0

## 2021-02-26 ENCOUNTER — Other Ambulatory Visit: Payer: Self-pay | Admitting: Family Medicine

## 2021-02-26 ENCOUNTER — Other Ambulatory Visit (HOSPITAL_COMMUNITY): Payer: Self-pay

## 2021-02-26 MED ORDER — AMLODIPINE BESYLATE 5 MG PO TABS
5.0000 mg | ORAL_TABLET | Freq: Every day | ORAL | 0 refills | Status: DC
  Filled 2021-02-26: qty 90, 90d supply, fill #0

## 2021-03-06 ENCOUNTER — Other Ambulatory Visit (HOSPITAL_COMMUNITY): Payer: Self-pay

## 2021-03-08 ENCOUNTER — Other Ambulatory Visit (HOSPITAL_COMMUNITY): Payer: Self-pay

## 2021-04-03 ENCOUNTER — Other Ambulatory Visit (HOSPITAL_COMMUNITY): Payer: Self-pay

## 2021-04-03 ENCOUNTER — Other Ambulatory Visit: Payer: Self-pay | Admitting: Family Medicine

## 2021-04-03 DIAGNOSIS — I10 Essential (primary) hypertension: Secondary | ICD-10-CM

## 2021-04-03 MED ORDER — LISINOPRIL-HYDROCHLOROTHIAZIDE 20-12.5 MG PO TABS
1.0000 | ORAL_TABLET | Freq: Every day | ORAL | 0 refills | Status: DC
  Filled 2021-04-03: qty 90, 90d supply, fill #0

## 2021-04-04 ENCOUNTER — Ambulatory Visit: Payer: Self-pay | Admitting: Family Medicine

## 2021-04-04 DIAGNOSIS — Z0000 Encounter for general adult medical examination without abnormal findings: Secondary | ICD-10-CM

## 2021-04-08 ENCOUNTER — Encounter: Payer: Self-pay | Admitting: Family Medicine

## 2021-05-02 ENCOUNTER — Other Ambulatory Visit (HOSPITAL_COMMUNITY): Payer: Self-pay

## 2021-05-02 ENCOUNTER — Other Ambulatory Visit: Payer: Self-pay | Admitting: Family Medicine

## 2021-05-03 ENCOUNTER — Other Ambulatory Visit (HOSPITAL_COMMUNITY): Payer: Self-pay

## 2021-05-03 MED ORDER — FUROSEMIDE 20 MG PO TABS
20.0000 mg | ORAL_TABLET | Freq: Every day | ORAL | 0 refills | Status: DC
Start: 2021-05-03 — End: 2021-05-31
  Filled 2021-05-03: qty 30, 30d supply, fill #0

## 2021-05-31 ENCOUNTER — Other Ambulatory Visit (HOSPITAL_COMMUNITY): Payer: Self-pay

## 2021-05-31 ENCOUNTER — Other Ambulatory Visit: Payer: Self-pay | Admitting: Family Medicine

## 2021-05-31 MED ORDER — AMLODIPINE BESYLATE 5 MG PO TABS
5.0000 mg | ORAL_TABLET | Freq: Every day | ORAL | 0 refills | Status: DC
  Filled 2021-05-31: qty 90, 90d supply, fill #0

## 2021-05-31 MED ORDER — FUROSEMIDE 20 MG PO TABS
20.0000 mg | ORAL_TABLET | Freq: Every day | ORAL | 0 refills | Status: DC
  Filled 2021-05-31: qty 30, 30d supply, fill #0

## 2021-06-03 ENCOUNTER — Other Ambulatory Visit (HOSPITAL_COMMUNITY): Payer: Self-pay

## 2021-06-13 ENCOUNTER — Other Ambulatory Visit (HOSPITAL_COMMUNITY): Payer: Self-pay

## 2021-06-13 ENCOUNTER — Telehealth: Payer: Self-pay | Admitting: Family Medicine

## 2021-06-13 NOTE — Telephone Encounter (Signed)
Left message for patient to call back and schedule Medicare Annual Wellness Visit (AWV) either virtually or in office. I left my number for patient to call [phone].  Last WTM ;02/21/20 please schedule at anytime with health coach  This should be a 45 minute visit.

## 2021-06-24 DIAGNOSIS — E663 Overweight: Secondary | ICD-10-CM | POA: Diagnosis not present

## 2021-06-24 DIAGNOSIS — F17200 Nicotine dependence, unspecified, uncomplicated: Secondary | ICD-10-CM | POA: Diagnosis not present

## 2021-06-24 DIAGNOSIS — Z7982 Long term (current) use of aspirin: Secondary | ICD-10-CM | POA: Diagnosis not present

## 2021-06-24 DIAGNOSIS — Z87891 Personal history of nicotine dependence: Secondary | ICD-10-CM | POA: Diagnosis not present

## 2021-06-24 DIAGNOSIS — N529 Male erectile dysfunction, unspecified: Secondary | ICD-10-CM | POA: Diagnosis not present

## 2021-06-24 DIAGNOSIS — K219 Gastro-esophageal reflux disease without esophagitis: Secondary | ICD-10-CM | POA: Diagnosis not present

## 2021-06-24 DIAGNOSIS — F411 Generalized anxiety disorder: Secondary | ICD-10-CM | POA: Diagnosis not present

## 2021-06-24 DIAGNOSIS — I10 Essential (primary) hypertension: Secondary | ICD-10-CM | POA: Diagnosis not present

## 2021-06-24 DIAGNOSIS — F329 Major depressive disorder, single episode, unspecified: Secondary | ICD-10-CM | POA: Diagnosis not present

## 2021-06-24 DIAGNOSIS — F325 Major depressive disorder, single episode, in full remission: Secondary | ICD-10-CM | POA: Diagnosis not present

## 2021-06-24 DIAGNOSIS — F419 Anxiety disorder, unspecified: Secondary | ICD-10-CM | POA: Diagnosis not present

## 2021-07-04 ENCOUNTER — Other Ambulatory Visit: Payer: Self-pay | Admitting: Family Medicine

## 2021-07-04 ENCOUNTER — Other Ambulatory Visit (HOSPITAL_COMMUNITY): Payer: Self-pay

## 2021-07-04 DIAGNOSIS — I10 Essential (primary) hypertension: Secondary | ICD-10-CM

## 2021-07-04 MED ORDER — FUROSEMIDE 20 MG PO TABS
20.0000 mg | ORAL_TABLET | Freq: Every day | ORAL | 0 refills | Status: DC
  Filled 2021-07-04: qty 30, 30d supply, fill #0

## 2021-07-04 MED ORDER — LISINOPRIL-HYDROCHLOROTHIAZIDE 20-12.5 MG PO TABS
1.0000 | ORAL_TABLET | Freq: Every day | ORAL | 0 refills | Status: DC
  Filled 2021-07-04: qty 30, 30d supply, fill #0

## 2021-07-05 ENCOUNTER — Ambulatory Visit (INDEPENDENT_AMBULATORY_CARE_PROVIDER_SITE_OTHER): Payer: PPO

## 2021-07-05 VITALS — Ht 76.0 in | Wt 218.0 lb

## 2021-07-05 DIAGNOSIS — Z0000 Encounter for general adult medical examination without abnormal findings: Secondary | ICD-10-CM | POA: Diagnosis not present

## 2021-07-05 NOTE — Progress Notes (Signed)
I connected with Nathaniel Pena today by telephone and verified that I am speaking with the correct person using two identifiers. Location patient: home Location provider: work Persons participating in the virtual visit: Nathaniel Pena, Nathaniel Durand LPN.   I discussed the limitations, risks, security and privacy concerns of performing an evaluation and management service by telephone and the availability of in person appointments. I also discussed with the patient that there may be a patient responsible charge related to this service. The patient expressed understanding and verbally consented to this telephonic visit.    Interactive audio and video telecommunications were attempted between this provider and patient, however failed, due to patient having technical difficulties OR patient did not have access to video capability.  We continued and completed visit with audio only.     Vital signs may be patient reported or missing.  Subjective:   [name] is a 66 y.o. male who presents for an Initial Medicare Annual Wellness Visit.  Review of Systems     Cardiac Risk Factors include: advanced age (>27men, >55 women);hypertension;male gender     Objective:    Today's Vitals   07/05/21 0856  Weight: 218 lb (98.9 kg)  Height: 6\' 4"  (1.93 m)   Body mass index is 26.54 kg/m.  Advanced Directives 07/05/2021 02/21/2020 05/26/2016  Does Patient Have a Medical Advance Directive? No No No  Would patient like information on creating a medical advance directive? - Yes (ED - Information included in AVS) -    Current Medications (verified) Outpatient Encounter Medications as of 07/05/2021  Medication Sig   amLODipine (NORVASC) 5 MG tablet Take 1 tablet (5 mg total) by mouth daily.   ARIPiprazole (ABILIFY) 5 MG tablet Take 1 tablet (5 mg total) by mouth daily.   aspirin 81 MG chewable tablet Chew 1 tablet (81 mg total) by mouth every 6 (six) hours as needed for mild pain.   escitalopram  (LEXAPRO) 20 MG tablet Take 1 tablet (20 mg total) by mouth daily.   furosemide (LASIX) 20 MG tablet Take 1 tablet (20 mg total) by mouth daily.   hydrOXYzine (ATARAX/VISTARIL) 10 MG tablet Take 1-2 tablets (10-20 mg total) by mouth every 4 (four) hours as needed for anxiety   lisinopril-hydrochlorothiazide (ZESTORETIC) 20-12.5 MG tablet Take 1 tablet by mouth daily.   meloxicam (MOBIC) 15 MG tablet Take 1 tablet (15 mg total) by mouth daily. Take with food, until the swelling has resolved (Patient not taking: Reported on 07/05/2021)   pantoprazole (PROTONIX) 40 MG tablet TAKE 1 TABLET BY MOUTH DAILY. (Patient not taking: Reported on 11/22/2020)   No facility-administered encounter medications on file as of 07/05/2021.    Allergies (verified) Patient has no known allergies.   History: Past Medical History:  Diagnosis Date   Colonic polyp    GAD (generalized anxiety disorder)    GERD (gastroesophageal reflux disease)    Hypertension    Seizures (HCC)    Tobacco abuse    smoker for 25 years. quit in 2002   Past Surgical History:  Procedure Laterality Date   COLONOSCOPY W/ POLYPECTOMY  2008   Family History  Problem Relation Age of Onset   Anxiety disorder Mother    Depression Mother    Hypertension Mother    Hypertension Brother    Depression Brother    Colon cancer Paternal Grandfather    Anxiety disorder Daughter    ADD / ADHD Daughter    ADD / ADHD Son    Anxiety  disorder Son    Social History   Socioeconomic History   Marital status: Married    Spouse name: Not on file   Number of children: Not on file   Years of education: Not on file   Highest education level: Not on file  Occupational History   Not on file  Tobacco Use   Smoking status: Former    Packs/day: 1.00    Types: Cigarettes    Quit date: 05/20/2019    Years since quitting: 2.1    Passive exposure: Past   Smokeless tobacco: Former    Types: Snuff  Vaping Use   Vaping Use: Never used  Substance and  Sexual Activity   Alcohol use: Yes    Comment: 4-5 drinks daily   Drug use: No   Sexual activity: Yes  Other Topics Concern   Not on file  Social History Narrative   Not on file   Social Determinants of Health   Financial Resource Strain: Low Risk    Difficulty of Paying Living Expenses: Not hard at all  Food Insecurity: No Food Insecurity   Worried About Charity fundraiser in the Last Year: Never true   [place] in the Last Year: Never true  Transportation Needs: No Transportation Needs   Lack of Transportation (Medical): No   Lack of Transportation (Non-Medical): No  Physical Activity: Insufficiently Active   Days of Exercise per Week: 7 days   Minutes of Exercise per Session: 20 min  Stress: No Stress Concern Present   Feeling of Stress : Only a little  Social Connections: Not on file    Tobacco Counseling Counseling given: Not Answered   Clinical Intake:  Pre-visit preparation completed: Yes  Pain : No/denies pain     Nutritional Status: BMI 25 -29 Overweight Nutritional Risks: None Diabetes: No  How often do you need to have someone help you when you read instructions, pamphlets, or other written materials from your doctor or pharmacy?: 1 - Never What is the last grade level you completed in school?: 12th grade  Diabetic? no  Interpreter Needed?: No  Information entered by :: NAllen LPN   Activities of Daily Living In your present state of health, do you have any difficulty performing the following activities: 07/05/2021  Hearing? N  Vision? N  Difficulty concentrating or making decisions? N  Walking or climbing stairs? N  Dressing or bathing? N  Doing errands, shopping? N  Preparing Food and eating ? N  Using the Toilet? N  In the past six months, have you accidently leaked urine? N  Do you have problems with loss of bowel control? N  Managing your Medications? N  Managing your Finances? N  Housekeeping or managing your Housekeeping? N   Some recent data might be hidden    Patient Care Team: Denita Lung, MD as PCP - General (Family Medicine)  Indicate any recent Medical Services you may have received from other than [facility] providers in the past year (date may be approximate).     Assessment:   This is a routine wellness examination for Annie Main.  Hearing/Vision screen Vision Screening - Comments:: Regular eye exams, Dr. Trecia Rogers, [facility]  Dietary issues and exercise activities discussed: Current Exercise Habits: Home exercise routine, Type of exercise: walking, Time (Minutes): 15, Frequency (Times/Week): 7, Weekly Exercise (Minutes/Week): 105   Goals Addressed             This Visit's Progress  Patient Stated       07/05/2021, wants BP to come down       Depression Screen PHQ 2/9 Scores 07/05/2021 10/04/2020 10/04/2020 02/21/2020 11/21/2015 03/26/2012  PHQ - 2 Score 0 0 0 0 6 0  PHQ- 9 Score - 3 - - - -    Fall Risk Fall Risk  07/05/2021 10/04/2020 02/21/2020 11/21/2015  Falls in the past year? 1 1 0 No  Comment got dehydrated and fell out - - -  Number falls in past yr: 0 0 - -  Injury with Fall? 0 1 - -  Risk for fall due to : Medication side effect Impaired balance/gait - -  Follow up Falls evaluation completed;Education provided;Falls prevention discussed Falls evaluation completed - -    FALL RISK PREVENTION PERTAINING TO THE HOME:  Any stairs in or around the home? Yes  If so, are there any without handrails? No  Home free of loose throw rugs in walkways, pet beds, electrical cords, etc? Yes  Adequate lighting in your home to reduce risk of falls? Yes   ASSISTIVE DEVICES UTILIZED TO PREVENT FALLS:  Life alert? No  Use of a cane, walker or w/c? No  Grab bars in the bathroom? No  Shower chair or bench in shower? No  Elevated toilet seat or a handicapped toilet? No   TIMED UP AND GO:  Was the test performed? No .      Cognitive Function:     6CIT Screen 07/05/2021  What  Year? 0 points  What month? 0 points  What time? 0 points  Count back from 20 0 points  Months in reverse 0 points  Repeat phrase 0 points  Total Score 0    Immunizations Immunization History  Administered Date(s) Administered   Fluad Quad(high Dose 65+) 02/21/2020, 03/03/2021   Influenza Split 01/30/2011, 02/24/2012, 02/16/2013   Influenza,inj,Quad PF,6+ Mos 01/12/2018, 02/17/2019   PFIZER(Purple Top)SARS-COV-2 Vaccination 06/24/2019, 07/10/2019, 02/21/2020   Tdap 03/26/2012   Zoster Recombinat (Shingrix) 04/06/2017, 06/10/2017    TDAP status: Up to date  Flu Vaccine status: Up to date  Pneumococcal vaccine status: Due, Education has been provided regarding the importance of this vaccine. Advised may receive this vaccine at local pharmacy or Health Dept. Aware to provide a copy of the vaccination record if obtained from local pharmacy or Health Dept. Verbalized acceptance and understanding.  Covid-19 vaccine status: Completed vaccines  Qualifies for Shingles Vaccine? Yes   Zostavax completed No   Shingrix Completed?: Yes  Screening Tests Health Maintenance  Topic Date Due   Pneumonia Vaccine 11+ Years old (1 - PCV) Never done   COVID-19 Vaccine (4 - Booster) 04/17/2020   TETANUS/TDAP  03/26/2022   COLONOSCOPY (Pts 45-60yrs Insurance coverage will need to be confirmed)  06/01/2022   INFLUENZA VACCINE  Completed   Hepatitis C Screening  Completed   Zoster Vaccines- Shingrix  Completed   HPV VACCINES  Aged Out    Health Maintenance  Health Maintenance Due  Topic Date Due   Pneumonia Vaccine 31+ Years old (1 - PCV) Never done   COVID-19 Vaccine (4 - Booster) 04/17/2020    Colorectal cancer screening: Type of screening: Colonoscopy. Completed 06/01/2012. Repeat every 10 years  Lung Cancer Screening: (Low Dose CT Chest recommended if Age 73-80 years, 30 pack-year currently smoking OR have quit w/in 15years.) does not qualify.   Lung Cancer Screening Referral:  no  Additional Screening:  Hepatitis C Screening: does qualify; Completed 05/27/2016  Vision  Screening: Recommended annual ophthalmology exams for early detection of glaucoma and other disorders of the eye. Is the patient up to date with their annual eye exam?  Yes  Who is the provider or what is the name of the office in which the patient attends annual eye exams? [facility] If pt is not established with a provider, would they like to be referred to a provider to establish care? No .   Dental Screening: Recommended annual dental exams for proper oral hygiene  Community Resource Referral / Chronic Care Management: CRR required this visit?  No   CCM required this visit?  No      Plan:     I have personally reviewed and noted the following in the patients chart:   Medical and social history Use of alcohol, tobacco or illicit drugs  Current medications and supplements including opioid prescriptions. Patient is not currently taking opioid prescriptions. Functional ability and status Nutritional status Physical activity Advanced directives List of other physicians Hospitalizations, surgeries, and ER visits in previous 12 months Vitals Screenings to include cognitive, depression, and falls Referrals and appointments  In addition, I have reviewed and discussed with patient certain preventive protocols, quality metrics, and best practice recommendations. A written personalized care plan for preventive services as well as general preventive health recommendations were provided to patient.     Kellie Simmering, LPN   3/[id number]/4451   Nurse Notes: none  Due to this being a virtual visit, the after visit summary with patients personalized plan was offered to patient via mail or my-chart. Patient would like to access on my-chart

## 2021-07-05 NOTE — Patient Instructions (Signed)
Nathaniel Pena , Thank you for taking time to come for your Medicare Wellness Visit. I appreciate your ongoing commitment to your health goals. Please review the following plan we discussed and let me know if I can assist you in the future.   Screening recommendations/referrals: Colonoscopy: completed 06/01/2012, due 06/01/2022 Recommended yearly ophthalmology/optometry visit for glaucoma screening and checkup Recommended yearly dental visit for hygiene and checkup  Vaccinations: Influenza vaccine: completed 03/03/2021, due next flu season Pneumococcal vaccine: due Tdap vaccine: completed 03/26/2012, due 03/26/2022 Shingles vaccine: completed   Covid-19:  06/24/2019, 07/10/2019, 02/21/2020  Advanced directives: Advance directive discussed with you today.   Conditions/risks identified: none  Next appointment: Follow up in one year for your annual wellness visit.   Preventive Care 67 Years and Older, Male Preventive care refers to lifestyle choices and visits with your health care provider that can promote health and wellness. What does preventive care include? A yearly physical exam. This is also called an annual well check. Dental exams once or twice a year. Routine eye exams. Ask your health care provider how often you should have your eyes checked. Personal lifestyle choices, including: Daily care of your teeth and gums. Regular physical activity. Eating a healthy diet. Avoiding tobacco and drug use. Limiting alcohol use. Practicing safe sex. Taking low doses of aspirin every day. Taking vitamin and mineral supplements as recommended by your health care provider. What happens during an annual well check? The services and screenings done by your health care provider during your annual well check will depend on your age, overall health, lifestyle risk factors, and family history of disease. Counseling  Your health care provider may ask you questions about your: Alcohol use. Tobacco  use. Drug use. Emotional well-being. Home and relationship well-being. Sexual activity. Eating habits. History of falls. Memory and ability to understand (cognition). Work and work Statistician. Screening  You may have the following tests or measurements: Height, weight, and BMI. Blood pressure. Lipid and cholesterol levels. These may be checked every 5 years, or more frequently if you are over 13 years old. Skin check. Lung cancer screening. You may have this screening every year starting at age 41 if you have a 30-pack-year history of smoking and currently smoke or have quit within the past 15 years. Fecal occult blood test (FOBT) of the stool. You may have this test every year starting at age 103. Flexible sigmoidoscopy or colonoscopy. You may have a sigmoidoscopy every 5 years or a colonoscopy every 10 years starting at age 78. Prostate cancer screening. Recommendations will vary depending on your family history and other risks. Hepatitis C blood test. Hepatitis B blood test. Sexually transmitted disease (STD) testing. Diabetes screening. This is done by checking your blood sugar (glucose) after you have not eaten for a while (fasting). You may have this done every 1-3 years. Abdominal aortic aneurysm (AAA) screening. You may need this if you are a current or former smoker. Osteoporosis. You may be screened starting at age 65 if you are at high risk. Talk with your health care provider about your test results, treatment options, and if necessary, the need for more tests. Vaccines  Your health care provider may recommend certain vaccines, such as: Influenza vaccine. This is recommended every year. Tetanus, diphtheria, and acellular pertussis (Tdap, Td) vaccine. You may need a Td booster every 10 years. Zoster vaccine. You may need this after age 54. Pneumococcal 13-valent conjugate (PCV13) vaccine. One dose is recommended after age 70. Pneumococcal polysaccharide (PPSV23)  vaccine.  One dose is recommended after age 62. Talk to your health care provider about which screenings and vaccines you need and how often you need them. This information is not intended to replace advice given to you by your health care provider. Make sure you discuss any questions you have with your health care provider. Document Released: 06/01/2015 Document Revised: 01/23/2016 Document Reviewed: 03/06/2015 Elsevier Interactive Patient Education  2017 [place] Prevention in the Home Falls can cause injuries. They can happen to people of all ages. There are many things you can do to make your home safe and to help prevent falls. What can I do on the outside of my home? Regularly fix the edges of walkways and driveways and fix any cracks. Remove anything that might make you trip as you walk through a door, such as a raised step or threshold. Trim any bushes or trees on the path to your home. Use bright outdoor lighting. Clear any walking paths of anything that might make someone trip, such as rocks or tools. Regularly check to see if handrails are loose or broken. Make sure that both sides of any steps have handrails. Any raised decks and porches should have guardrails on the edges. Have any leaves, snow, or ice cleared regularly. Use sand or salt on walking paths during winter. Clean up any spills in your garage right away. This includes oil or grease spills. What can I do in the bathroom? Use night lights. Install grab bars by the toilet and in the tub and shower. Do not use towel bars as grab bars. Use non-skid mats or decals in the tub or shower. If you need to sit down in the shower, use a plastic, non-slip stool. Keep the floor dry. Clean up any water that spills on the floor as soon as it happens. Remove soap buildup in the tub or shower regularly. Attach bath mats securely with double-sided non-slip rug tape. Do not have throw rugs and other things on the floor that can make  you trip. What can I do in the bedroom? Use night lights. Make sure that you have a light by your bed that is easy to reach. Do not use any sheets or blankets that are too big for your bed. They should not hang down onto the floor. Have a firm chair that has side arms. You can use this for support while you get dressed. Do not have throw rugs and other things on the floor that can make you trip. What can I do in the kitchen? Clean up any spills right away. Avoid walking on wet floors. Keep items that you use a lot in easy-to-reach places. If you need to reach something above you, use a strong step stool that has a grab bar. Keep electrical cords out of the way. Do not use floor polish or wax that makes floors slippery. If you must use wax, use non-skid floor wax. Do not have throw rugs and other things on the floor that can make you trip. What can I do with my stairs? Do not leave any items on the stairs. Make sure that there are handrails on both sides of the stairs and use them. Fix handrails that are broken or loose. Make sure that handrails are as long as the stairways. Check any carpeting to make sure that it is firmly attached to the stairs. Fix any carpet that is loose or worn. Avoid having throw rugs at the top or bottom  of the stairs. If you do have throw rugs, attach them to the floor with carpet tape. Make sure that you have a light switch at the top of the stairs and the bottom of the stairs. If you do not have them, ask someone to add them for you. What else can I do to help prevent falls? Wear shoes that: Do not have high heels. Have rubber bottoms. Are comfortable and fit you well. Are closed at the toe. Do not wear sandals. If you use a stepladder: Make sure that it is fully opened. Do not climb a closed stepladder. Make sure that both sides of the stepladder are locked into place. Ask someone to hold it for you, if possible. Clearly mark and make sure that you can  see: Any grab bars or handrails. First and last steps. Where the edge of each step is. Use tools that help you move around (mobility aids) if they are needed. These include: Canes. Walkers. Scooters. Crutches. Turn on the lights when you go into a dark area. Replace any light bulbs as soon as they burn out. Set up your furniture so you have a clear path. Avoid moving your furniture around. If any of your floors are uneven, fix them. If there are any pets around you, be aware of where they are. Review your medicines with your doctor. Some medicines can make you feel dizzy. This can increase your chance of falling. Ask your doctor what other things that you can do to help prevent falls. This information is not intended to replace advice given to you by your health care provider. Make sure you discuss any questions you have with your health care provider. Document Released: 03/01/2009 Document Revised: 10/11/2015 Document Reviewed: 06/09/2014 Elsevier Interactive Patient Education  2017 [employer].

## 2021-08-07 ENCOUNTER — Other Ambulatory Visit: Payer: Self-pay | Admitting: Family Medicine

## 2021-08-07 ENCOUNTER — Other Ambulatory Visit (HOSPITAL_COMMUNITY): Payer: Self-pay

## 2021-08-07 DIAGNOSIS — I10 Essential (primary) hypertension: Secondary | ICD-10-CM

## 2021-08-07 MED ORDER — FUROSEMIDE 20 MG PO TABS
20.0000 mg | ORAL_TABLET | Freq: Every day | ORAL | 0 refills | Status: DC
  Filled 2021-08-07: qty 30, 30d supply, fill #0

## 2021-08-07 MED ORDER — LISINOPRIL-HYDROCHLOROTHIAZIDE 20-12.5 MG PO TABS
1.0000 | ORAL_TABLET | Freq: Every day | ORAL | 0 refills | Status: DC
  Filled 2021-08-07: qty 30, 30d supply, fill #0

## 2021-08-29 ENCOUNTER — Other Ambulatory Visit (HOSPITAL_COMMUNITY): Payer: Self-pay

## 2021-08-29 ENCOUNTER — Ambulatory Visit (INDEPENDENT_AMBULATORY_CARE_PROVIDER_SITE_OTHER): Payer: PPO | Admitting: Family Medicine

## 2021-08-29 ENCOUNTER — Encounter: Payer: Self-pay | Admitting: Family Medicine

## 2021-08-29 VITALS — BP 134/80 | HR 83 | Temp 98.8°F | Wt 217.2 lb

## 2021-08-29 DIAGNOSIS — I10 Essential (primary) hypertension: Secondary | ICD-10-CM | POA: Diagnosis not present

## 2021-08-29 DIAGNOSIS — D126 Benign neoplasm of colon, unspecified: Secondary | ICD-10-CM | POA: Diagnosis not present

## 2021-08-29 DIAGNOSIS — F1010 Alcohol abuse, uncomplicated: Secondary | ICD-10-CM | POA: Diagnosis not present

## 2021-08-29 DIAGNOSIS — N529 Male erectile dysfunction, unspecified: Secondary | ICD-10-CM | POA: Diagnosis not present

## 2021-08-29 DIAGNOSIS — Z0000 Encounter for general adult medical examination without abnormal findings: Secondary | ICD-10-CM

## 2021-08-29 DIAGNOSIS — F411 Generalized anxiety disorder: Secondary | ICD-10-CM | POA: Diagnosis not present

## 2021-08-29 DIAGNOSIS — Z87891 Personal history of nicotine dependence: Secondary | ICD-10-CM | POA: Diagnosis not present

## 2021-08-29 DIAGNOSIS — Z136 Encounter for screening for cardiovascular disorders: Secondary | ICD-10-CM | POA: Diagnosis not present

## 2021-08-29 DIAGNOSIS — Z13220 Encounter for screening for lipoid disorders: Secondary | ICD-10-CM | POA: Diagnosis not present

## 2021-08-29 DIAGNOSIS — Z23 Encounter for immunization: Secondary | ICD-10-CM | POA: Diagnosis not present

## 2021-08-29 DIAGNOSIS — F322 Major depressive disorder, single episode, severe without psychotic features: Secondary | ICD-10-CM

## 2021-08-29 NOTE — Progress Notes (Signed)
? ?  Subjective:  ? ? Patient ID: Nathaniel Pena, male    DOB: [birth year], 66 y.o.   MRN: [id number] ? ?HPI ?He is here for complete examination.  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  He states that he apparently did miss a call back from gastroenterology.  He does check his blood pressure at home and states the numbers he is getting her look good.  Does not smoke and still drinks 4-6 beverages per day.  He is involved in counseling and states that it has been very very useful.  He is stable on his present dosing of Abilify and Lexapro.  He has seen urology for his erectile dysfunction and states that the injections seem to be working.  He does not think he needs the Lasix anymore as he is not having the swelling he had in the past.  Otherwise he has no concerns or complaints.  Family and social history as well as health maintenance and immunizations was reviewed. ? ? ?Review of Systems  ?All other systems reviewed and are negative. ? ?   ?Objective:  ? Physical Exam ?Alert and in no distress. Tympanic membranes and canals are normal. Pharyngeal area is normal. Neck is supple without adenopathy or thyromegaly. Cardiac exam shows a regular sinus rhythm without murmurs or gallops. Lungs are clear to auscultation.  Abdominal exam shows no masses or tenderness. ? ? ? ? ?   ?Assessment & Plan:  ?Routine general medical examination at a health care facility ? ?Alcohol abuse ? ?Essential hypertension - Plan: CBC with Differential/Platelet, Comprehensive metabolic panel ? ?Erectile dysfunction, unspecified erectile dysfunction type ? ?Former smoker ? ?Adenomatous polyp of colon, unspecified part of colon - Plan: Ambulatory referral to Gastroenterology ? ?GAD (generalized anxiety disorder) ? ?Screening for lipid disorders - Plan: Lipid panel ? ?Screening for AAA (abdominal aortic aneurysm) - Plan: US AORTA ? ?Need for vaccination against Streptococcus pneumoniae -  Plan: Pneumococcal conjugate vaccine 20-valent ? ?Current severe episode of major depressive disorder without psychotic features without prior episode ([place]) ?He will continue in counseling as well as on his psychotropic medications.  Health maintenance was updated.  He will stop taking his Lasix.  Recommend he bring his blood pressure cuff in and measure it against ours to make sure it is accurate.  Recommend he go to the drugstore to get his Tdap. ? ?

## 2021-08-29 NOTE — Patient Instructions (Addendum)
cut your alcohol consumption down to 1 or 2 drinks per day.  Give your liver a break!! ?

## 2021-08-30 ENCOUNTER — Other Ambulatory Visit (HOSPITAL_COMMUNITY): Payer: Self-pay

## 2021-08-30 LAB — LIPID PANEL
Chol/HDL Ratio: 1.6 ratio (ref 0.0–5.0)
Cholesterol, Total: 171 mg/dL (ref 100–199)
HDL: 105 mg/dL (ref >39)
LDL Chol Calc (NIH): 57 mg/dL (ref 0–99)
Triglycerides: 41 mg/dL (ref 0–149)
VLDL Cholesterol Cal: 9 mg/dL (ref 5–40)

## 2021-08-30 LAB — CBC WITH DIFFERENTIAL/PLATELET
Basophils Absolute: 0 10*3/uL (ref 0.0–0.2)
Basos: 1 %
EOS (ABSOLUTE): 0 10*3/uL (ref 0.0–0.4)
Eos: 0 %
Hematocrit: 38.6 % (ref 37.5–51.0)
Hemoglobin: 14 g/dL (ref 13.0–17.7)
Immature Grans (Abs): 0 10*3/uL (ref 0.0–0.1)
Immature Granulocytes: 0 %
Lymphocytes Absolute: 0.9 10*3/uL (ref 0.7–3.1)
Lymphs: 20 %
MCH: 32.5 pg (ref 26.6–33.0)
MCHC: 36.3 g/dL — ABNORMAL HIGH (ref 31.5–35.7)
MCV: 90 fL (ref 79–97)
Monocytes Absolute: 0.6 10*3/uL (ref 0.1–0.9)
Monocytes: 12 %
Neutrophils Absolute: 3.2 10*3/uL (ref 1.4–7.0)
Neutrophils: 67 %
Platelets: 225 10*3/uL (ref 150–450)
RBC: 4.31 x10E6/uL (ref 4.14–5.80)
RDW: 12.3 % (ref 11.6–15.4)
WBC: 4.8 10*3/uL (ref 3.4–10.8)

## 2021-08-30 LAB — COMPREHENSIVE METABOLIC PANEL
ALT: 20 IU/L (ref 0–44)
AST: 34 IU/L (ref 0–40)
Albumin/Globulin Ratio: 2.3 — ABNORMAL HIGH (ref 1.2–2.2)
Albumin: 4.6 g/dL (ref 3.8–4.8)
Alkaline Phosphatase: 73 IU/L (ref 44–121)
BUN/Creatinine Ratio: 11 (ref 10–24)
BUN: 8 mg/dL (ref 8–27)
Bilirubin Total: 0.5 mg/dL (ref 0.0–1.2)
CO2: 28 mmol/L (ref 20–29)
Calcium: 9.4 mg/dL (ref 8.6–10.2)
Chloride: 93 mmol/L — ABNORMAL LOW (ref 96–106)
Creatinine, Ser: 0.75 mg/dL — ABNORMAL LOW (ref 0.76–1.27)
Globulin, Total: 2 g/dL (ref 1.5–4.5)
Glucose: 107 mg/dL — ABNORMAL HIGH (ref 70–99)
Potassium: 4.4 mmol/L (ref 3.5–5.2)
Sodium: 136 mmol/L (ref 134–144)
Total Protein: 6.6 g/dL (ref 6.0–8.5)
eGFR: 100 mL/min/{1.73_m2} (ref >59)

## 2021-09-02 ENCOUNTER — Other Ambulatory Visit: Payer: Self-pay | Admitting: Family Medicine

## 2021-09-02 ENCOUNTER — Other Ambulatory Visit (HOSPITAL_COMMUNITY): Payer: Self-pay

## 2021-09-02 DIAGNOSIS — I10 Essential (primary) hypertension: Secondary | ICD-10-CM

## 2021-09-02 MED ORDER — LISINOPRIL-HYDROCHLOROTHIAZIDE 20-12.5 MG PO TABS
1.0000 | ORAL_TABLET | Freq: Every day | ORAL | 0 refills | Status: DC
  Filled 2021-09-02: qty 30, 30d supply, fill #0

## 2021-09-02 MED ORDER — AMLODIPINE BESYLATE 5 MG PO TABS
5.0000 mg | ORAL_TABLET | Freq: Every day | ORAL | 0 refills | Status: DC
  Filled 2021-09-02: qty 90, 90d supply, fill #0

## 2021-09-06 ENCOUNTER — Telehealth: Payer: Self-pay | Admitting: Family Medicine

## 2021-09-06 NOTE — Telephone Encounter (Signed)
Referral Followup °

## 2021-09-10 ENCOUNTER — Encounter: Payer: Self-pay | Admitting: Internal Medicine

## 2021-09-13 ENCOUNTER — Other Ambulatory Visit (HOSPITAL_COMMUNITY): Payer: Self-pay

## 2021-10-04 ENCOUNTER — Other Ambulatory Visit: Payer: Self-pay | Admitting: Family Medicine

## 2021-10-04 ENCOUNTER — Other Ambulatory Visit (HOSPITAL_COMMUNITY): Payer: Self-pay

## 2021-10-04 DIAGNOSIS — I10 Essential (primary) hypertension: Secondary | ICD-10-CM

## 2021-10-04 MED ORDER — LISINOPRIL-HYDROCHLOROTHIAZIDE 20-12.5 MG PO TABS
1.0000 | ORAL_TABLET | Freq: Every day | ORAL | 5 refills | Status: DC
  Filled 2021-10-04: qty 30, 30d supply, fill #0
  Filled 2021-11-05: qty 30, 30d supply, fill #1
  Filled 2021-12-05: qty 30, 30d supply, fill #2
  Filled 2022-01-03: qty 30, 30d supply, fill #3
  Filled 2022-02-04: qty 30, 30d supply, fill #4
  Filled 2022-03-04: qty 30, 30d supply, fill #5

## 2021-10-15 ENCOUNTER — Other Ambulatory Visit (HOSPITAL_COMMUNITY): Payer: Self-pay

## 2021-11-05 ENCOUNTER — Other Ambulatory Visit (HOSPITAL_COMMUNITY): Payer: Self-pay

## 2021-11-06 ENCOUNTER — Encounter: Payer: Self-pay | Admitting: Psychiatry

## 2021-11-06 ENCOUNTER — Ambulatory Visit: Payer: PPO | Admitting: Psychiatry

## 2021-11-06 ENCOUNTER — Other Ambulatory Visit (HOSPITAL_COMMUNITY): Payer: Self-pay

## 2021-11-06 DIAGNOSIS — F411 Generalized anxiety disorder: Secondary | ICD-10-CM

## 2021-11-06 DIAGNOSIS — F341 Dysthymic disorder: Secondary | ICD-10-CM | POA: Diagnosis not present

## 2021-11-06 DIAGNOSIS — F5105 Insomnia due to other mental disorder: Secondary | ICD-10-CM | POA: Diagnosis not present

## 2021-11-06 DIAGNOSIS — F99 Mental disorder, not otherwise specified: Secondary | ICD-10-CM

## 2021-11-06 DIAGNOSIS — F410 Panic disorder [episodic paroxysmal anxiety] without agoraphobia: Secondary | ICD-10-CM | POA: Diagnosis not present

## 2021-11-06 MED ORDER — ARIPIPRAZOLE 5 MG PO TABS
5.0000 mg | ORAL_TABLET | Freq: Every day | ORAL | 3 refills | Status: DC
  Filled 2021-11-06 – 2021-12-09 (×3): qty 90, 90d supply, fill #0
  Filled 2022-03-13: qty 90, 90d supply, fill #1
  Filled 2022-06-11: qty 90, 90d supply, fill #2
  Filled 2022-09-09: qty 90, 90d supply, fill #3

## 2021-11-06 MED ORDER — MIRTAZAPINE 15 MG PO TABS
15.0000 mg | ORAL_TABLET | Freq: Every evening | ORAL | 2 refills | Status: DC | PRN
  Filled 2021-11-06: qty 90, 90d supply, fill #0

## 2021-11-06 MED ORDER — ESCITALOPRAM OXALATE 20 MG PO TABS
20.0000 mg | ORAL_TABLET | Freq: Every day | ORAL | 3 refills | Status: DC
Start: 2021-11-06 — End: 2022-11-07
  Filled 2021-11-06 – 2021-12-05 (×2): qty 90, 90d supply, fill #0
  Filled 2022-03-04: qty 90, 90d supply, fill #1
  Filled 2022-06-03: qty 90, 90d supply, fill #2
  Filled 2022-09-01: qty 90, 90d supply, fill #3

## 2021-11-06 MED ORDER — HYDROXYZINE HCL 10 MG PO TABS
10.0000 mg | ORAL_TABLET | ORAL | 3 refills | Status: DC
  Filled 2021-11-06 – 2022-01-10 (×2): qty 420, 35d supply, fill #0
  Filled 2022-05-02: qty 420, 35d supply, fill #1
  Filled 2022-07-23 – 2022-07-24 (×3): qty 420, 35d supply, fill #2
  Filled 2022-11-03: qty 420, 35d supply, fill #3

## 2021-11-06 NOTE — Progress Notes (Unsigned)
Nathaniel Forts Drummond [id number] 1956/03/21 66 y.o.  Subjective:   Patient ID:  Nathaniel Pena is a 66 y.o. (DOB 07/02/1955) male.  Chief Complaint:  Chief Complaint  Patient presents with   Follow-up    Depression, anxiety, and ETOH use    HPI Nathaniel Pena presents to the office today for follow-up of depression, anxiety, and ETOH use.   He denies any recent depression. He reports that he has occasional episodes of anxiety and Hydroxyzine prn is effective. He denies anxiety on a daily basis. He reports that he is now trying not to worry about things that he cannot control. He reports poor sleep and having difficulty staying asleep. He has difficulty returning to sleep after awakening to use the bathroom. He reports vivid dreams. He reports that his appetite. He reports that his energy and motivation have been good. Concentration is adequate. Denies SI.   He remains close with wife and children. He and his wife remain separated. He is currently not working. Plays golf on occasion. Started playing pool. Walks daily and enjoys his dogs. Son moved out and got an apartment with his girlfriend. Daughter is now doing travel nursing.   He reports drinking 4-6 drinks a day. He reports that he almost entirely stopped beer and now drinks Shearon Stalls with Diet Coke. He reports he "thought about it and ruled it out" in terms of cutting back ETOH use.   "Psychologically, I don't think I have ever been in a better place."    [place] Office Visit from 11/06/2021 in [place] Visit from 11/06/2020 in [place] Visit from 11/07/2019 in [place] Total Score 0 0 0      PHQ2-9    [place] Office Visit from 08/29/2021 in [place] from 07/05/2021 in [place] Visit from 10/04/2020 in [place] Visit from 02/21/2020 in [place] from 11/21/2015 in [place]  PHQ-2 Total Score 0 0 0 0 6  PHQ-9 Total Score 0 -- 3 -- --        Review of Systems:  Review of Systems  Cardiovascular:  Negative for leg swelling.  Gastrointestinal: Negative.   Musculoskeletal:  Negative for gait problem.  Neurological:  Negative for tremors and headaches.  Psychiatric/Behavioral:         Please refer to HPI    Medications: I have reviewed the patient's current medications.  Current Outpatient Medications  Medication Sig Dispense Refill   amLODipine (NORVASC) 5 MG tablet Take 1 tablet (5 mg total) by mouth daily. 90 tablet 0   ARIPiprazole (ABILIFY) 5 MG tablet Take 1 tablet (5 mg total) by mouth daily. 90 tablet 3   aspirin 81 MG chewable tablet Chew 1 tablet (81 mg total) by mouth every 6 (six) hours as needed for mild pain. 90 tablet 3   escitalopram (LEXAPRO) 20 MG tablet Take 1 tablet (20 mg total) by mouth daily. 90 tablet 3   furosemide (LASIX) 20 MG tablet Take 1 tablet (20 mg total) by mouth daily. 30 tablet 0   hydrOXYzine (ATARAX) 10 MG tablet Take 1-2 tablets (10-20 mg total) by mouth every 4 (four) hours as needed for anxiety 420 tablet 3   lisinopril-hydrochlorothiazide (ZESTORETIC) 20-12.5 MG tablet Take 1 tablet by mouth daily. 30 tablet 5   meloxicam (MOBIC) 15 MG tablet Take 1 tablet (15 mg  total) by mouth daily. Take with food, until the swelling has resolved (Patient not taking: Reported on 07/05/2021) 15 tablet 0   pantoprazole (PROTONIX) 40 MG tablet TAKE 1 TABLET BY MOUTH DAILY. (Patient not taking: Reported on 11/22/2020) 90 tablet 0   No current facility-administered medications for this visit.    Medication Side Effects: None  Allergies: No Known Allergies  Past Medical History:  Diagnosis Date   Colonic polyp    GAD (generalized anxiety disorder)    GERD (gastroesophageal reflux disease)    Hypertension    Seizures ([place])    Tobacco abuse    smoker for 25  years. quit in 2002    Past Medical History, Surgical history, Social history, and Family history were reviewed and updated as appropriate.   Please see review of systems for further details on the patient's review from today.   Objective:   Physical Exam:  There were no vitals taken for this visit.  Physical Exam Constitutional:      General: He is not in acute distress. Musculoskeletal:        General: No deformity.  Neurological:     Mental Status: He is alert and oriented to person, place, and time.     Coordination: Coordination normal.  Psychiatric:        Attention and Perception: Attention and perception normal. He does not perceive auditory or visual hallucinations.        Mood and Affect: Mood normal. Mood is not anxious or depressed. Affect is not labile, blunt, angry or inappropriate.        Speech: Speech normal.        Behavior: Behavior normal.        Thought Content: Thought content normal. Thought content is not paranoid or delusional. Thought content does not include homicidal or suicidal ideation. Thought content does not include homicidal or suicidal plan.        Cognition and Memory: Cognition and memory normal.        Judgment: Judgment normal.     Comments: Insight intact     Lab Review:     Component Value Date/Time   NA 136 08/29/2021 1339   K 4.4 08/29/2021 1339   CL 93 (L) 08/29/2021 1339   CO2 28 08/29/2021 1339   GLUCOSE 107 (H) 08/29/2021 1339   GLUCOSE 82 06/03/2016 1211   BUN 8 08/29/2021 1339   CREATININE 0.75 (L) 08/29/2021 1339   CREATININE 0.66 (L) 06/03/2016 1211   CALCIUM 9.4 08/29/2021 1339   PROT 6.6 08/29/2021 1339   ALBUMIN 4.6 08/29/2021 1339   AST 34 08/29/2021 1339   ALT 20 08/29/2021 1339   ALKPHOS 73 08/29/2021 1339   BILITOT 0.5 08/29/2021 1339   GFRNONAA 100 08/29/2019 1000   GFRAA 116 08/29/2019 1000       Component Value Date/Time   WBC 4.8 08/29/2021 1339   WBC 6.4 06/03/2016 1211   RBC 4.31 08/29/2021 1339    RBC 4.52 06/03/2016 1211   HGB 14.0 08/29/2021 1339   HCT 38.6 08/29/2021 1339   PLT 225 08/29/2021 1339   MCV 90 08/29/2021 1339   MCH 32.5 08/29/2021 1339   MCH 33.8 (H) 06/03/2016 1211   MCHC 36.3 (H) 08/29/2021 1339   MCHC 34.6 06/03/2016 1211   RDW 12.3 08/29/2021 1339   LYMPHSABS 0.9 08/29/2021 1339   MONOABS 1,024 (H) 06/03/2016 1211   EOSABS 0.0 08/29/2021 1339   BASOSABS 0.0 08/29/2021 1339    No results  found for: "POCLITH", "LITHIUM"   No results found for: "PHENYTOIN", "PHENOBARB", "VALPROATE", "CBMZ"   .res Assessment: Plan:   Declines therapy.  There are no diagnoses linked to this encounter.   Please see After Visit Summary for patient specific instructions.  Future Appointments  Date Time Provider [place]  01/30/2022 10:00 AM Denita Lung, MD [facility]  09/01/2022  9:30 AM Denita Lung, MD [facility]    No orders of the defined types were placed in this encounter.   -------------------------------

## 2021-11-07 ENCOUNTER — Other Ambulatory Visit (HOSPITAL_COMMUNITY): Payer: Self-pay

## 2021-11-20 ENCOUNTER — Other Ambulatory Visit (HOSPITAL_COMMUNITY): Payer: Self-pay

## 2021-11-21 ENCOUNTER — Other Ambulatory Visit (HOSPITAL_COMMUNITY): Payer: Self-pay

## 2021-12-05 ENCOUNTER — Other Ambulatory Visit: Payer: Self-pay | Admitting: Family Medicine

## 2021-12-05 ENCOUNTER — Other Ambulatory Visit (HOSPITAL_COMMUNITY): Payer: Self-pay

## 2021-12-05 MED ORDER — AMLODIPINE BESYLATE 5 MG PO TABS
5.0000 mg | ORAL_TABLET | Freq: Every day | ORAL | 0 refills | Status: DC
  Filled 2021-12-05: qty 90, 90d supply, fill #0

## 2021-12-06 ENCOUNTER — Other Ambulatory Visit (HOSPITAL_COMMUNITY): Payer: Self-pay

## 2021-12-09 ENCOUNTER — Other Ambulatory Visit (HOSPITAL_COMMUNITY): Payer: Self-pay

## 2021-12-27 ENCOUNTER — Other Ambulatory Visit (HOSPITAL_COMMUNITY): Payer: Self-pay

## 2021-12-27 MED ORDER — PEG 3350-KCL-NA BICARB-NACL 420 G PO SOLR
ORAL | 0 refills | Status: DC
  Filled 2021-12-27: qty 4000, 1d supply, fill #0

## 2021-12-30 ENCOUNTER — Other Ambulatory Visit (HOSPITAL_COMMUNITY): Payer: Self-pay

## 2022-01-01 DIAGNOSIS — Z09 Encounter for follow-up examination after completed treatment for conditions other than malignant neoplasm: Secondary | ICD-10-CM | POA: Diagnosis not present

## 2022-01-01 DIAGNOSIS — D124 Benign neoplasm of descending colon: Secondary | ICD-10-CM | POA: Diagnosis not present

## 2022-01-01 DIAGNOSIS — K5730 Diverticulosis of large intestine without perforation or abscess without bleeding: Secondary | ICD-10-CM | POA: Diagnosis not present

## 2022-01-01 DIAGNOSIS — Z86010 Personal history of colonic polyps: Secondary | ICD-10-CM | POA: Diagnosis not present

## 2022-01-01 DIAGNOSIS — D123 Benign neoplasm of transverse colon: Secondary | ICD-10-CM | POA: Diagnosis not present

## 2022-01-01 LAB — HM COLONOSCOPY

## 2022-01-03 ENCOUNTER — Other Ambulatory Visit (HOSPITAL_COMMUNITY): Payer: Self-pay

## 2022-01-03 DIAGNOSIS — D123 Benign neoplasm of transverse colon: Secondary | ICD-10-CM | POA: Diagnosis not present

## 2022-01-10 ENCOUNTER — Other Ambulatory Visit (HOSPITAL_COMMUNITY): Payer: Self-pay

## 2022-01-22 ENCOUNTER — Encounter: Payer: Self-pay | Admitting: Internal Medicine

## 2022-01-29 ENCOUNTER — Encounter: Payer: Self-pay | Admitting: Family Medicine

## 2022-01-29 DIAGNOSIS — D369 Benign neoplasm, unspecified site: Secondary | ICD-10-CM | POA: Insufficient documentation

## 2022-01-30 ENCOUNTER — Encounter: Payer: PPO | Admitting: Family Medicine

## 2022-02-04 ENCOUNTER — Encounter: Payer: Self-pay | Admitting: Family Medicine

## 2022-02-04 ENCOUNTER — Other Ambulatory Visit (HOSPITAL_COMMUNITY): Payer: Self-pay

## 2022-02-25 ENCOUNTER — Encounter: Payer: Self-pay | Admitting: Internal Medicine

## 2022-03-04 ENCOUNTER — Other Ambulatory Visit: Payer: Self-pay | Admitting: Family Medicine

## 2022-03-04 ENCOUNTER — Other Ambulatory Visit (HOSPITAL_COMMUNITY): Payer: Self-pay

## 2022-03-04 MED ORDER — AMLODIPINE BESYLATE 5 MG PO TABS
5.0000 mg | ORAL_TABLET | Freq: Every day | ORAL | 1 refills | Status: DC
  Filled 2022-03-04: qty 90, 90d supply, fill #0
  Filled 2022-06-03: qty 90, 90d supply, fill #1

## 2022-03-10 ENCOUNTER — Encounter: Payer: Self-pay | Admitting: Internal Medicine

## 2022-03-13 ENCOUNTER — Other Ambulatory Visit (HOSPITAL_COMMUNITY): Payer: Self-pay

## 2022-04-03 ENCOUNTER — Other Ambulatory Visit: Payer: Self-pay | Admitting: Family Medicine

## 2022-04-03 ENCOUNTER — Other Ambulatory Visit (HOSPITAL_COMMUNITY): Payer: Self-pay

## 2022-04-03 DIAGNOSIS — I10 Essential (primary) hypertension: Secondary | ICD-10-CM

## 2022-04-03 MED ORDER — LISINOPRIL-HYDROCHLOROTHIAZIDE 20-12.5 MG PO TABS
1.0000 | ORAL_TABLET | Freq: Every day | ORAL | 5 refills | Status: DC
  Filled 2022-04-03: qty 30, 30d supply, fill #0
  Filled 2022-05-02: qty 30, 30d supply, fill #1
  Filled 2022-06-03: qty 30, 30d supply, fill #2
  Filled 2022-07-02: qty 30, 30d supply, fill #3
  Filled 2022-08-03: qty 30, 30d supply, fill #4
  Filled 2022-09-02: qty 30, 30d supply, fill #5

## 2022-05-02 ENCOUNTER — Other Ambulatory Visit (HOSPITAL_COMMUNITY): Payer: Self-pay

## 2022-05-05 ENCOUNTER — Other Ambulatory Visit (HOSPITAL_COMMUNITY): Payer: Self-pay

## 2022-06-03 ENCOUNTER — Other Ambulatory Visit (HOSPITAL_COMMUNITY): Payer: Self-pay

## 2022-06-11 ENCOUNTER — Other Ambulatory Visit (HOSPITAL_COMMUNITY): Payer: Self-pay

## 2022-07-02 ENCOUNTER — Other Ambulatory Visit (HOSPITAL_COMMUNITY): Payer: Self-pay

## 2022-07-24 ENCOUNTER — Other Ambulatory Visit (HOSPITAL_COMMUNITY): Payer: Self-pay

## 2022-07-28 ENCOUNTER — Other Ambulatory Visit (HOSPITAL_COMMUNITY): Payer: Self-pay

## 2022-08-04 ENCOUNTER — Other Ambulatory Visit (HOSPITAL_COMMUNITY): Payer: Self-pay

## 2022-08-12 ENCOUNTER — Telehealth: Payer: Self-pay | Admitting: Family Medicine

## 2022-08-12 NOTE — Telephone Encounter (Signed)
Called patient to schedule Medicare Annual Wellness Visit (AWV). Left message for patient to call back and schedule Medicare Annual Wellness Visit (AWV).  Last date of AWV: 07/05/21  Please schedule an appointment at any time with [facility].  If any questions, please contact me at [phone].  Thank you ,  Barkley Boards AWV direct phone # [phone]

## 2022-08-26 ENCOUNTER — Other Ambulatory Visit (HOSPITAL_COMMUNITY): Payer: Self-pay

## 2022-08-26 ENCOUNTER — Other Ambulatory Visit: Payer: Self-pay | Admitting: Family Medicine

## 2022-08-26 MED ORDER — AMLODIPINE BESYLATE 5 MG PO TABS
5.0000 mg | ORAL_TABLET | Freq: Every day | ORAL | 0 refills | Status: DC
  Filled 2022-08-26: qty 30, 30d supply, fill #0

## 2022-08-28 ENCOUNTER — Other Ambulatory Visit (HOSPITAL_COMMUNITY): Payer: Self-pay

## 2022-09-01 ENCOUNTER — Other Ambulatory Visit (HOSPITAL_COMMUNITY): Payer: Self-pay

## 2022-09-01 ENCOUNTER — Ambulatory Visit: Payer: PPO | Admitting: Family Medicine

## 2022-09-02 ENCOUNTER — Other Ambulatory Visit (HOSPITAL_COMMUNITY): Payer: Self-pay

## 2022-09-08 ENCOUNTER — Encounter: Payer: Self-pay | Admitting: Family Medicine

## 2022-09-08 ENCOUNTER — Ambulatory Visit (INDEPENDENT_AMBULATORY_CARE_PROVIDER_SITE_OTHER): Payer: PPO | Admitting: Family Medicine

## 2022-09-08 ENCOUNTER — Other Ambulatory Visit (HOSPITAL_COMMUNITY): Payer: Self-pay

## 2022-09-08 VITALS — BP 168/92 | HR 74 | Temp 98.3°F | Resp 16 | Ht 76.0 in | Wt 222.0 lb

## 2022-09-08 DIAGNOSIS — Z23 Encounter for immunization: Secondary | ICD-10-CM | POA: Diagnosis not present

## 2022-09-08 DIAGNOSIS — Z13220 Encounter for screening for lipoid disorders: Secondary | ICD-10-CM

## 2022-09-08 DIAGNOSIS — R351 Nocturia: Secondary | ICD-10-CM | POA: Diagnosis not present

## 2022-09-08 DIAGNOSIS — D696 Thrombocytopenia, unspecified: Secondary | ICD-10-CM

## 2022-09-08 DIAGNOSIS — Z87898 Personal history of other specified conditions: Secondary | ICD-10-CM | POA: Diagnosis not present

## 2022-09-08 DIAGNOSIS — Z0000 Encounter for general adult medical examination without abnormal findings: Secondary | ICD-10-CM

## 2022-09-08 DIAGNOSIS — Z87891 Personal history of nicotine dependence: Secondary | ICD-10-CM

## 2022-09-08 DIAGNOSIS — Z9189 Other specified personal risk factors, not elsewhere classified: Secondary | ICD-10-CM | POA: Diagnosis not present

## 2022-09-08 DIAGNOSIS — F322 Major depressive disorder, single episode, severe without psychotic features: Secondary | ICD-10-CM

## 2022-09-08 DIAGNOSIS — N529 Male erectile dysfunction, unspecified: Secondary | ICD-10-CM

## 2022-09-08 DIAGNOSIS — Z131 Encounter for screening for diabetes mellitus: Secondary | ICD-10-CM

## 2022-09-08 DIAGNOSIS — I493 Ventricular premature depolarization: Secondary | ICD-10-CM

## 2022-09-08 DIAGNOSIS — D126 Benign neoplasm of colon, unspecified: Secondary | ICD-10-CM | POA: Diagnosis not present

## 2022-09-08 DIAGNOSIS — F1010 Alcohol abuse, uncomplicated: Secondary | ICD-10-CM | POA: Diagnosis not present

## 2022-09-08 DIAGNOSIS — I10 Essential (primary) hypertension: Secondary | ICD-10-CM | POA: Diagnosis not present

## 2022-09-08 DIAGNOSIS — F411 Generalized anxiety disorder: Secondary | ICD-10-CM | POA: Diagnosis not present

## 2022-09-08 LAB — CBC WITH DIFFERENTIAL/PLATELET

## 2022-09-08 LAB — LIPID PANEL

## 2022-09-08 LAB — POCT GLYCOSYLATED HEMOGLOBIN (HGB A1C): Hemoglobin A1C: 5.5 % (ref 4.0–5.6)

## 2022-09-08 LAB — COMPREHENSIVE METABOLIC PANEL
BUN: 12 mg/dL (ref 8–27)
Chloride: 95 mmol/L — ABNORMAL LOW (ref 96–106)
Glucose: 112 mg/dL — ABNORMAL HIGH (ref 70–99)

## 2022-09-08 MED ORDER — AMLODIPINE BESYLATE 5 MG PO TABS
5.0000 mg | ORAL_TABLET | Freq: Every day | ORAL | 0 refills | Status: DC
  Filled 2022-09-08 – 2022-09-26 (×2): qty 30, 30d supply, fill #0

## 2022-09-08 MED ORDER — LISINOPRIL-HYDROCHLOROTHIAZIDE 20-12.5 MG PO TABS
1.0000 | ORAL_TABLET | Freq: Every day | ORAL | 5 refills | Status: DC
  Filled 2022-09-08 – 2022-09-28 (×2): qty 30, 30d supply, fill #0
  Filled 2022-10-30: qty 30, 30d supply, fill #1

## 2022-09-08 NOTE — Progress Notes (Signed)
Nathaniel Pena is a 67 y.o. male who presents for annual wellness visit and follow-up on chronic medical conditions.  He has no particular concerns or complaints.  He is followed by Corie Chiquito for his underlying depression and is doing quite nicely on his present medication regimen.  He does have a history of adenomatous colonic polyp and is scheduled for routine follow-up on that.  He continues to drink and has no desire to quit. He does complain of nocturia but he is not interested in trying to pursue this any further.  He does have remote history of seizure but has not been on any medication.  He is a former smoker.  He has had a AAA evaluation.  He continues on amlodipine and lisinopril.  He does have a history of LVH and PVCs.  Recent blood work did show slightly elevated blood glucose.  He has retired and enjoying his retirement.  He does keep in touch with his ex-wife and that seems to going well.  They do have 2 children.  Immunizations and Health Maintenance Immunization History  Administered Date(s) Administered   Fluad Quad(high Dose 65+) 02/21/2020, 03/03/2021   Influenza Split 01/30/2011, 02/24/2012, 02/16/2013   Influenza,inj,Quad PF,6+ Mos 01/12/2018, 02/17/2019   PFIZER(Purple Top)SARS-COV-2 Vaccination 06/24/2019, 07/10/2019, 02/21/2020   PNEUMOCOCCAL CONJUGATE-20 08/29/2021   Tdap 03/26/2012   Zoster Recombinat (Shingrix) 04/06/2017, 06/10/2017   Health Maintenance Due  Topic Date Due   COVID-19 Vaccine (4 - 2023-24 season) 01/17/2022   DTaP/Tdap/Td (2 - Td or Tdap) 03/26/2022   Medicare Annual Wellness (AWV)  07/05/2022    Last colonoscopy: 01/01/2022 Last PSA: no record Dentist: within the last year Ophtho:within the last year Exercise: walking daily     Other doctors caring for patient include: Corie Chiquito, NP   Advanced Directives:  No info given  Depression screen:  See questionnaire below.        09/08/2022    9:30 AM 08/29/2021   11:06 AM  08/29/2021   11:05 AM 07/05/2021    9:07 AM 10/04/2020    8:36 AM  Depression screen PHQ 2/9  Decreased Interest 0 0 0 0 0  Down, Depressed, Hopeless 0 0 0 0 0  PHQ - 2 Score 0 0 0 0 0  Altered sleeping 1 0   1  Tired, decreased energy 0 0   2  Change in appetite 0 0   0  Feeling bad or failure about yourself  1 0   0  Trouble concentrating 0 0   0  Moving slowly or fidgety/restless 0 0   0  Suicidal thoughts 0 0   0  PHQ-9 Score 2 0   3  Difficult doing work/chores Not difficult at all Not difficult at all   Not difficult at all    Fall Screen: See Questionaire below.      09/08/2022    9:30 AM 08/29/2021   11:04 AM 07/05/2021    9:05 AM 10/04/2020    8:36 AM 02/21/2020    1:46 PM  Fall Risk   Falls in the past year? 0 1 1 1  0  Comment   got dehydrated and fell out    Number falls in past yr: 0 0 0 0   Injury with Fall? 0 0 0 1   Risk for fall due to : No Fall Risks No Fall Risks Medication side effect Impaired balance/gait   Follow up Falls evaluation completed Falls evaluation completed Falls evaluation completed;Education provided;Falls  prevention discussed Falls evaluation completed     ADL screen:  See questionnaire below.  Functional Status Survey: Is the patient deaf or have difficulty hearing?: No Does the patient have difficulty concentrating, remembering, or making decisions?: No Does the patient have difficulty walking or climbing stairs?: No Does the patient have difficulty dressing or bathing?: No Does the patient have difficulty doing errands alone such as visiting a doctor's office or shopping?: No   Review of Systems Blood work was reviewed. Constitutional: -, -unexpected weight change, -anorexia, -fatigue Allergy: -sneezing, -itching, -congestion Dermatology: denies changing moles, rash, lumps ENT: -runny nose, -ear pain, -sore throat,  Cardiology:  -chest pain, -palpitations, -orthopnea, Respiratory: -cough, -shortness of breath, -dyspnea on exertion,  -wheezing,  Gastroenterology: -abdominal pain, -nausea, -vomiting, -diarrhea, -constipation, -dysphagia Hematology: -bleeding or bruising problems Musculoskeletal: -arthralgias, -myalgias, -joint swelling, -back pain, - Ophthalmology: -vision changes,  Urology: -dysuria, -difficulty urinating,  -urinary frequency, -urgency, incontinence Neurology: -, -numbness, , -memory loss, -falls, -dizziness    PHYSICAL EXAM:  BP (!) 146/108 (BP Location: Left Arm, Cuff Size: Normal)   Pulse 74   Temp 98.3 F (36.8 C) (Oral)   Resp 16   Ht  (1.93 m)   Wt 222 lb (100.7 kg)   SpO2 98% Comment: room air  BMI 27.02 kg/m   General Appearance: Alert, cooperative, no distress, appears stated age Head: Normocephalic, without obvious abnormality, atraumatic Eyes: PERRL, conjunctiva/corneas clear, EOM's intact,  Ears: Normal TM's and external ear canals Nose: Nares normal, mucosa normal, no drainage or sinus   tenderness Throat: Lips, mucosa, and tongue normal; teeth and gums normal Neck: Supple, no lymphadenopathy, thyroid:no enlargement/tenderness/nodules; no carotid bruit or JVD Lungs: Clear to auscultation bilaterally without wheezes, rales or ronchi; respirations unlabored Heart: Regular rate and rhythm, S1 and S2 normal, no murmur, rub or gallop Abdomen: Soft, non-tender, nondistended, normoactive bowel sounds, no masses, no hepatosplenomegaly Skin: Skin color, texture, turgor normal, no rashes or lesions Lymph nodes: Cervical, supraclavicular, and axillary nodes normal Neurologic: CNII-XII intact, normal strength, sensation and gait; reflexes 2+ and symmetric throughout   Psych: Normal mood, affect, hygiene and grooming Hemoglobin A1c is 5.5. ASSESSMENT/PLAN: Routine general medical examination at a health care facility  PVC's (premature ventricular contractions)  Adenomatous polyp of colon, unspecified part of colon  GAD (generalized anxiety disorder)  Erectile dysfunction,  unspecified erectile dysfunction type  Alcohol abuse  Current severe episode of major depressive disorder without psychotic features without prior episode  Thrombocytopenia  Former smoker  Essential hypertension  Screening for lipid disorders    Discussed PSA screening (risks/benefits), Immunization recommendations discussed.  Colonoscopy recommendations reviewed.  Also discussed starting on a statin but will wait for the results to show up.  He will continue to be followed by Corie Chiquito.  He will also continue with ophthalmology.  Colonoscopy is already scheduled in the future.  He is not interested in quitting drinking.   Medicare Attestation I have personally reviewed: The patient's medical and social history Their use of alcohol, tobacco or illicit drugs Their current medications and supplements The patient's functional ability including ADLs,fall risks, home safety risks, cognitive, and hearing and visual impairment Diet and physical activities Evidence for depression or mood disorders  The patient's weight, height, and BMI have been recorded in the chart.  I have made referrals, counseling, and provided education to the patient based on review of the above and I have provided the patient with a written personalized care plan for preventive services.  Sharlot Gowda, MD   09/08/2022

## 2022-09-09 ENCOUNTER — Other Ambulatory Visit (HOSPITAL_COMMUNITY): Payer: Self-pay

## 2022-09-09 ENCOUNTER — Other Ambulatory Visit: Payer: Self-pay

## 2022-09-09 LAB — CBC WITH DIFFERENTIAL/PLATELET
Hematocrit: 46.2 % (ref 37.5–51.0)
Immature Granulocytes: 0 %
MCHC: 35.5 g/dL (ref 31.5–35.7)
MCV: 92 fL (ref 79–97)
Neutrophils Absolute: 3 10*3/uL (ref 1.4–7.0)
Neutrophils: 56 %
Platelets: 179 10*3/uL (ref 150–450)
RBC: 5 x10E6/uL (ref 4.14–5.80)
RDW: 12.7 % (ref 11.6–15.4)
WBC: 5.4 10*3/uL (ref 3.4–10.8)

## 2022-09-09 LAB — COMPREHENSIVE METABOLIC PANEL
AST: 75 IU/L — ABNORMAL HIGH (ref 0–40)
Albumin/Globulin Ratio: 2.2 (ref 1.2–2.2)
Albumin: 4.7 g/dL (ref 3.9–4.9)
Alkaline Phosphatase: 89 IU/L (ref 44–121)
BUN/Creatinine Ratio: 17 (ref 10–24)
Bilirubin Total: 0.7 mg/dL (ref 0.0–1.2)
CO2: 26 mmol/L (ref 20–29)
Calcium: 9.6 mg/dL (ref 8.6–10.2)
Creatinine, Ser: 0.72 mg/dL — ABNORMAL LOW (ref 0.76–1.27)
Globulin, Total: 2.1 g/dL (ref 1.5–4.5)
Potassium: 4.2 mmol/L (ref 3.5–5.2)
Sodium: 137 mmol/L (ref 134–144)
Total Protein: 6.8 g/dL (ref 6.0–8.5)
eGFR: 100 mL/min/{1.73_m2} (ref >59)

## 2022-09-09 LAB — PSA: Prostate Specific Ag, Serum: 1.5 ng/mL (ref 0.0–4.0)

## 2022-09-09 LAB — LIPID PANEL
Chol/HDL Ratio: 1.8 ratio (ref 0.0–5.0)
HDL: 126 mg/dL (ref >39)
Triglycerides: 52 mg/dL (ref 0–149)

## 2022-09-09 MED ORDER — ATORVASTATIN CALCIUM 20 MG PO TABS
20.0000 mg | ORAL_TABLET | Freq: Every day | ORAL | 3 refills | Status: DC
Start: 2022-09-09 — End: 2024-03-18
  Filled 2022-09-09: qty 90, 90d supply, fill #0

## 2022-09-09 NOTE — Addendum Note (Signed)
Addended by: Ronnald Nian on: 09/09/2022 04:53 PM   Modules accepted: Orders

## 2022-09-25 ENCOUNTER — Other Ambulatory Visit (HOSPITAL_COMMUNITY): Payer: Self-pay

## 2022-09-26 ENCOUNTER — Other Ambulatory Visit (HOSPITAL_COMMUNITY): Payer: Self-pay

## 2022-09-29 ENCOUNTER — Other Ambulatory Visit (HOSPITAL_COMMUNITY): Payer: Self-pay

## 2022-10-09 ENCOUNTER — Other Ambulatory Visit (HOSPITAL_COMMUNITY): Payer: Self-pay

## 2022-10-09 MED ORDER — AREXVY 120 MCG/0.5ML IM SUSR
0.5000 mL | Freq: Once | INTRAMUSCULAR | 0 refills | Status: AC
  Filled 2022-10-09: qty 0.5, 1d supply, fill #0

## 2022-10-09 MED ORDER — BOOSTRIX 5-2.5-18.5 LF-MCG/0.5 IM SUSY
0.5000 mL | PREFILLED_SYRINGE | Freq: Once | INTRAMUSCULAR | 0 refills | Status: AC
  Filled 2022-10-09: qty 0.5, 1d supply, fill #0

## 2022-10-30 ENCOUNTER — Other Ambulatory Visit: Payer: Self-pay | Admitting: Family Medicine

## 2022-10-30 ENCOUNTER — Other Ambulatory Visit: Payer: Self-pay

## 2022-10-30 ENCOUNTER — Other Ambulatory Visit (HOSPITAL_COMMUNITY): Payer: Self-pay

## 2022-10-30 DIAGNOSIS — I10 Essential (primary) hypertension: Secondary | ICD-10-CM

## 2022-10-30 MED ORDER — AMLODIPINE BESYLATE 5 MG PO TABS
5.0000 mg | ORAL_TABLET | Freq: Every day | ORAL | 0 refills | Status: DC
Start: 2022-10-30 — End: 2022-11-13
  Filled 2022-10-30: qty 30, 30d supply, fill #0

## 2022-10-31 ENCOUNTER — Other Ambulatory Visit (HOSPITAL_COMMUNITY): Payer: Self-pay

## 2022-11-03 ENCOUNTER — Other Ambulatory Visit (HOSPITAL_COMMUNITY): Payer: Self-pay

## 2022-11-06 ENCOUNTER — Encounter: Payer: Self-pay | Admitting: Family Medicine

## 2022-11-06 ENCOUNTER — Ambulatory Visit (INDEPENDENT_AMBULATORY_CARE_PROVIDER_SITE_OTHER): Payer: PPO | Admitting: Family Medicine

## 2022-11-06 VITALS — BP 130/90 | HR 64 | Temp 98.1°F | Resp 18 | Wt 216.0 lb

## 2022-11-06 DIAGNOSIS — F322 Major depressive disorder, single episode, severe without psychotic features: Secondary | ICD-10-CM | POA: Diagnosis not present

## 2022-11-06 DIAGNOSIS — I10 Essential (primary) hypertension: Secondary | ICD-10-CM | POA: Diagnosis not present

## 2022-11-06 DIAGNOSIS — F1010 Alcohol abuse, uncomplicated: Secondary | ICD-10-CM

## 2022-11-06 NOTE — Progress Notes (Signed)
   Subjective:    Patient ID: Nathaniel Pena, male    DOB: [birth year], 67 y.o.   MRN: [id number]  HPI He is here for a recheck on his blood pressure.  Presently he is taking Norvasc, lisinopril/HCTZ.  He continues in counseling and is now on Abilify and Lexapro.  He states that he is doing quite well on that and is very happy with the progress that he is made.  He has cut back on his alcohol use but is not entirely quit.   Review of Systems     Objective:   Physical Exam Alert and in no distress.  Blood pressure is recorded and is elevated.       Assessment & Plan:  Primary hypertension  Current severe episode of major depressive disorder without psychotic features without prior episode (HCC)  Alcohol abuse I recommend an Production designer, theatre/television/film against ours.  He states that his blood pressures at home are usually much lower. I then complemented him on doing much better with his depression and encouraged him to work on entirely eliminating alcohol.

## 2022-11-06 NOTE — Patient Instructions (Signed)
Check out the DASH diet 

## 2022-11-07 ENCOUNTER — Ambulatory Visit: Payer: PPO | Admitting: Psychiatry

## 2022-11-07 ENCOUNTER — Encounter: Payer: Self-pay | Admitting: Psychiatry

## 2022-11-07 ENCOUNTER — Other Ambulatory Visit (HOSPITAL_COMMUNITY): Payer: Self-pay

## 2022-11-07 DIAGNOSIS — F99 Mental disorder, not otherwise specified: Secondary | ICD-10-CM

## 2022-11-07 DIAGNOSIS — F5105 Insomnia due to other mental disorder: Secondary | ICD-10-CM | POA: Diagnosis not present

## 2022-11-07 DIAGNOSIS — F410 Panic disorder [episodic paroxysmal anxiety] without agoraphobia: Secondary | ICD-10-CM | POA: Diagnosis not present

## 2022-11-07 DIAGNOSIS — F411 Generalized anxiety disorder: Secondary | ICD-10-CM | POA: Diagnosis not present

## 2022-11-07 DIAGNOSIS — F341 Dysthymic disorder: Secondary | ICD-10-CM

## 2022-11-07 MED ORDER — MIRTAZAPINE 15 MG PO TABS
15.0000 mg | ORAL_TABLET | Freq: Every evening | ORAL | 3 refills | Status: DC | PRN
Start: 2022-11-07 — End: 2023-11-06
  Filled 2022-11-07: qty 90, 90d supply, fill #0
  Filled 2023-08-06: qty 90, 90d supply, fill #1
  Filled 2023-11-05: qty 90, 90d supply, fill #2

## 2022-11-07 MED ORDER — HYDROXYZINE HCL 10 MG PO TABS
10.0000 mg | ORAL_TABLET | ORAL | 3 refills | Status: DC
Start: 2022-11-07 — End: 2023-11-06
  Filled 2022-11-07 – 2023-02-24 (×2): qty 420, 35d supply, fill #0
  Filled 2023-05-11: qty 420, 35d supply, fill #1
  Filled 2023-07-28: qty 420, 35d supply, fill #2
  Filled 2023-10-22: qty 420, 35d supply, fill #3

## 2022-11-07 MED ORDER — ESCITALOPRAM OXALATE 20 MG PO TABS
20.0000 mg | ORAL_TABLET | Freq: Every day | ORAL | 3 refills | Status: DC
Start: 2022-11-07 — End: 2023-11-06
  Filled 2022-11-07 – 2022-11-28 (×2): qty 90, 90d supply, fill #0
  Filled 2023-03-02: qty 90, 90d supply, fill #1
  Filled 2023-05-29: qty 90, 90d supply, fill #2
  Filled 2023-09-02: qty 90, 90d supply, fill #3

## 2022-11-07 MED ORDER — ARIPIPRAZOLE 5 MG PO TABS
5.0000 mg | ORAL_TABLET | Freq: Every day | ORAL | 3 refills | Status: DC
Start: 2022-11-07 — End: 2023-11-06
  Filled 2022-11-07 – 2022-12-04 (×2): qty 90, 90d supply, fill #0
  Filled 2023-03-05: qty 90, 90d supply, fill #1
  Filled 2023-06-01: qty 90, 90d supply, fill #2
  Filled 2023-09-02: qty 90, 90d supply, fill #3

## 2022-11-07 NOTE — Progress Notes (Signed)
Nathaniel Pena [id number] [birth year] 67 y.o.  Subjective:   Patient ID:  Nathaniel Pena is a 67 y.o. (DOB [birth year]) male.  Chief Complaint:  Chief Complaint  Patient presents with   Depression   Anxiety    HPI Nathaniel Gentile Pollitt presents to the office today for follow-up of depression, anxiety, and insomnia. He reports that he has been experiencing some depression. "It never has gone completely away... very minor depression compared to what I used to feel."  He reports that he has been having dreams that "leave me in a bit of a hole." Denies nightmares. He reports that dreams are often work-related and he is trying to complete something or meet a deadline. He reports some anxiety in the morning. He reports that hydroxyzine is helpful for his anxiety. He reports that he had severe anxiety when he ran out of hydroxyzine to the point of n/v. Sleeping well. He reports motivation is low, "but I have plenty of energy." Appetite has been good. Denies difficulty with concentration. Denies SI.   "No real changes."  He reports that he "getting along with my ex" and that she and their children took him out to eat for Father's day.  He is no longer working part-time. He has been walking his dog daily. He spends time at home and "too much time at the pub." He reports ETOH use is "about the same, about 4 a day." He reports that he is "too scared to cut back, everything is going so well."   Reports children are doing well. Daughter is a travel Engineer, civil (consulting). Son has been working in Consulting civil engineer.   Geneticist, molecular Office Visit from 11/07/2022 in [facility] Office Visit from 11/06/2021 in [facility] Office Visit from 11/06/2020 in [facility] Office Visit from 11/07/2019 in [facility]  AIMS Total Score 0 0 0 0      PHQ2-9    Flowsheet Row Office Visit from 09/08/2022 in [facility]  Office Visit from 08/29/2021 in [facility] Clinical Support from 07/05/2021 in [facility] Office Visit from 10/04/2020 in [facility] Office Visit from 02/21/2020 in [facility]  PHQ-2 Total Score 0 0 0 0 0  PHQ-9 Total Score 2 0 -- 3 --        Review of Systems:  Review of Systems  Musculoskeletal:  Negative for gait problem.  Neurological:  Negative for tremors.  Psychiatric/Behavioral:         Please refer to HPI    Medications: I have reviewed the patient's current medications.  Current Outpatient Medications  Medication Sig Dispense Refill   amLODipine (NORVASC) 5 MG tablet Take 1 tablet (5 mg total) by mouth daily. 30 tablet 0   aspirin 81 MG chewable tablet Chew 1 tablet (81 mg total) by mouth every 6 (six) hours as needed for mild pain. (Patient taking differently: Chew 81 mg by mouth daily.) 90 tablet 3   lisinopril-hydrochlorothiazide (ZESTORETIC) 20-12.5 MG tablet Take 1 tablet by mouth daily. 30 tablet 5   ARIPiprazole (ABILIFY) 5 MG tablet Take 1 tablet (5 mg total) by mouth daily. 90 tablet 3   atorvastatin (LIPITOR) 20 MG tablet Take 1 tablet (20 mg total) by mouth daily. (Patient not taking: Reported on 11/07/2022) 90 tablet 3   escitalopram (LEXAPRO) 20 MG tablet Take 1 tablet (20 mg total) by mouth daily. 90 tablet 3  furosemide (LASIX) 20 MG tablet Take 1 tablet (20 mg total) by mouth daily. (Patient not taking: Reported on 11/07/2022) 30 tablet 0   hydrOXYzine (ATARAX) 10 MG tablet Take 1-2 tablets (10-20 mg total) by mouth every 4 (four) hours as needed for anxiety 420 tablet 3   mirtazapine (REMERON) 15 MG tablet Take 1 tablet (15 mg total) by mouth at bedtime as needed. 90 tablet 3   No current facility-administered medications for this visit.    Medication Side Effects: None  Allergies: No Known Allergies  Past Medical History:  Diagnosis Date   Colonic polyp    GAD (generalized anxiety disorder)    GERD  (gastroesophageal reflux disease)    Hypertension    Seizures (HCC)    Tobacco abuse    smoker for 25 years. quit in 2002    Past Medical History, Surgical history, Social history, and Family history were reviewed and updated as appropriate.   Please see review of systems for further details on the patient's review from today.   Objective:   Physical Exam:  BP (!) 155/80   Pulse 64   Physical Exam Constitutional:      General: He is not in acute distress. Musculoskeletal:        General: No deformity.  Neurological:     Mental Status: He is alert and oriented to person, place, and time.     Coordination: Coordination normal.  Psychiatric:        Attention and Perception: Attention and perception normal. He does not perceive auditory or visual hallucinations.        Mood and Affect: Affect is not labile, blunt, angry or inappropriate.        Speech: Speech normal.        Behavior: Behavior normal.        Thought Content: Thought content normal. Thought content is not paranoid or delusional. Thought content does not include homicidal or suicidal ideation. Thought content does not include homicidal or suicidal plan.        Cognition and Memory: Cognition and memory normal.        Judgment: Judgment normal.     Comments: Insight intact Mood is mildly depressed and anxious.     Lab Review:     Component Value Date/Time   NA 137 09/08/2022 1039   K 4.2 09/08/2022 1039   CL 95 (L) 09/08/2022 1039   CO2 26 09/08/2022 1039   GLUCOSE 112 (H) 09/08/2022 1039   GLUCOSE 82 06/03/2016 1211   BUN 12 09/08/2022 1039   CREATININE 0.72 (L) 09/08/2022 1039   CREATININE 0.66 (L) 06/03/2016 1211   CALCIUM 9.6 09/08/2022 1039   PROT 6.8 09/08/2022 1039   ALBUMIN 4.7 09/08/2022 1039   AST 75 (H) 09/08/2022 1039   ALT 65 (H) 09/08/2022 1039   ALKPHOS 89 09/08/2022 1039   BILITOT 0.7 09/08/2022 1039   GFRNONAA 100 08/29/2019 1000   GFRAA 116 08/29/2019 1000       Component Value  Date/Time   WBC 5.4 09/08/2022 1039   WBC 6.4 06/03/2016 1211   RBC 5.00 09/08/2022 1039   RBC 4.52 06/03/2016 1211   HGB 16.4 09/08/2022 1039   HCT 46.2 09/08/2022 1039   PLT 179 09/08/2022 1039   MCV 92 09/08/2022 1039   MCH 32.8 09/08/2022 1039   MCH 33.8 (H) 06/03/2016 1211   MCHC 35.5 09/08/2022 1039   MCHC 34.6 06/03/2016 1211   RDW 12.7 09/08/2022 1039   LYMPHSABS  1.5 09/08/2022 1039   MONOABS 1,024 (H) 06/03/2016 1211   EOSABS 0.2 09/08/2022 1039   BASOSABS 0.1 09/08/2022 1039    No results found for: "POCLITH", "LITHIUM"   No results found for: "PHENYTOIN", "PHENOBARB", "VALPROATE", "CBMZ"   .res Assessment: Plan:   Discussed re-starting Remeron to improve mild anxiety and pression.  Discussed that mirtazapine may also improve sleep quality and decrease distressing dreams.  Patient reports that he will restart Remeron. Recommend continuing to decrease ETOH intake since alcohol can negatively affect mood and anxiety. Also discussed that his liver enzymes were recently elevated and PCP commented that this is likely due to alcohol intake. Pt reports that he will try to reduce ETOH further. Will continue Lexapro 20 mg daily for anxiety and depression. Continue Abilify 5 mg daily for depression. Continue hydroxyzine as needed for anxiety. Recommended follow-up in 6 months.  Patient requested to extend follow-up to 1 year and reports that he will contact us sooner if needed.  Pt to follow-up in one year or sooner if clinically indicated.    Jeannett Senior was seen today for depression and anxiety.  Diagnoses and all orders for this visit:  Dysthymic disorder -     mirtazapine (REMERON) 15 MG tablet; Take 1 tablet (15 mg total) by mouth at bedtime as needed. -     ARIPiprazole (ABILIFY) 5 MG tablet; Take 1 tablet (5 mg total) by mouth daily. -     escitalopram (LEXAPRO) 20 MG tablet; Take 1 tablet (20 mg total) by mouth daily.  Insomnia due to other mental disorder -      mirtazapine (REMERON) 15 MG tablet; Take 1 tablet (15 mg total) by mouth at bedtime as needed.  Generalized anxiety disorder -     hydrOXYzine (ATARAX) 10 MG tablet; Take 1-2 tablets (10-20 mg total) by mouth every 4 (four) hours as needed for anxiety -     escitalopram (LEXAPRO) 20 MG tablet; Take 1 tablet (20 mg total) by mouth daily.  Panic disorder -     hydrOXYzine (ATARAX) 10 MG tablet; Take 1-2 tablets (10-20 mg total) by mouth every 4 (four) hours as needed for anxiety -     escitalopram (LEXAPRO) 20 MG tablet; Take 1 tablet (20 mg total) by mouth daily.     Please see After Visit Summary for patient specific instructions.  Future Appointments  Date Time Provider Department Center  11/13/2022  9:30 AM [facility] NURSE [facility]  11/06/2023  9:30 AM Corie Chiquito, PMHNP [facility] None    No orders of the defined types were placed in this encounter.   -------------------------------

## 2022-11-13 ENCOUNTER — Ambulatory Visit (INDEPENDENT_AMBULATORY_CARE_PROVIDER_SITE_OTHER): Payer: PPO | Admitting: Family Medicine

## 2022-11-13 ENCOUNTER — Other Ambulatory Visit (HOSPITAL_COMMUNITY): Payer: Self-pay

## 2022-11-13 ENCOUNTER — Other Ambulatory Visit: Payer: PPO

## 2022-11-13 ENCOUNTER — Encounter: Payer: Self-pay | Admitting: Family Medicine

## 2022-11-13 VITALS — BP 146/97 | HR 80 | Temp 98.1°F | Resp 16 | Wt 213.6 lb

## 2022-11-13 DIAGNOSIS — I10 Essential (primary) hypertension: Secondary | ICD-10-CM

## 2022-11-13 MED ORDER — OLMESARTAN-AMLODIPINE-HCTZ 40-5-12.5 MG PO TABS
1.0000 | ORAL_TABLET | Freq: Every day | ORAL | 1 refills | Status: AC
Start: 2022-11-13 — End: ?
  Filled 2022-11-13 – 2023-02-11 (×2): qty 30, 30d supply, fill #0

## 2022-11-13 NOTE — Patient Instructions (Signed)
Send a message on MyChart in about a month to let me know how you are doing

## 2022-11-13 NOTE — Progress Notes (Signed)
   Subjective:    Patient ID: Nathaniel Pena, male    DOB: [birth year], 67 y.o.   MRN: [id number]  HPI He is here for recheck on his blood pressure.  He did bring his cuff in with him and measure against ours.  It seems to be very accurate.  He has been getting 140/100 at home.   Review of Systems     Objective:    Physical Exam Alert and in no distress.  Blood pressure is recorded.       Assessment & Plan:   Problem List Items Addressed This Visit     Hypertension - Primary   Relevant Medications   Olmesartan-amLODIPine-HCTZ (TRIBENZOR) 40-5-12.5 MG TABS  I will switch him to Tribenzor to try and lighten the pill load and also switch to an ARB.  He is to check his blood pressure at home and send me a message on MyChart in about a month.

## 2022-11-14 ENCOUNTER — Other Ambulatory Visit (HOSPITAL_COMMUNITY): Payer: Self-pay

## 2022-11-14 MED ORDER — AMLODIPINE BESYLATE 5 MG PO TABS
5.0000 mg | ORAL_TABLET | Freq: Every day | ORAL | 1 refills | Status: DC
  Filled 2022-11-14 – 2022-11-28 (×2): qty 30, 30d supply, fill #0
  Filled 2022-12-30: qty 30, 30d supply, fill #1

## 2022-11-14 MED ORDER — HYDROCHLOROTHIAZIDE 12.5 MG PO TABS
12.5000 mg | ORAL_TABLET | Freq: Every day | ORAL | 1 refills | Status: DC
  Filled 2022-11-14: qty 30, 30d supply, fill #0
  Filled 2022-12-16: qty 30, 30d supply, fill #1

## 2022-11-14 MED ORDER — OLMESARTAN MEDOXOMIL 40 MG PO TABS
40.0000 mg | ORAL_TABLET | Freq: Every day | ORAL | 1 refills | Status: DC
  Filled 2022-11-14: qty 30, 30d supply, fill #0
  Filled 2022-12-16: qty 30, 30d supply, fill #1

## 2022-11-17 ENCOUNTER — Other Ambulatory Visit (HOSPITAL_COMMUNITY): Payer: Self-pay

## 2022-11-17 ENCOUNTER — Other Ambulatory Visit: Payer: Self-pay

## 2022-11-24 ENCOUNTER — Other Ambulatory Visit (HOSPITAL_COMMUNITY): Payer: Self-pay

## 2022-11-28 ENCOUNTER — Other Ambulatory Visit (HOSPITAL_COMMUNITY): Payer: Self-pay

## 2022-12-04 ENCOUNTER — Other Ambulatory Visit (HOSPITAL_COMMUNITY): Payer: Self-pay

## 2022-12-16 ENCOUNTER — Other Ambulatory Visit: Payer: Self-pay

## 2022-12-30 ENCOUNTER — Other Ambulatory Visit: Payer: Self-pay

## 2023-01-14 ENCOUNTER — Other Ambulatory Visit: Payer: Self-pay | Admitting: Family Medicine

## 2023-01-14 ENCOUNTER — Other Ambulatory Visit (HOSPITAL_COMMUNITY): Payer: Self-pay

## 2023-01-15 ENCOUNTER — Other Ambulatory Visit (HOSPITAL_COMMUNITY): Payer: Self-pay

## 2023-01-15 MED ORDER — HYDROCHLOROTHIAZIDE 12.5 MG PO TABS
12.5000 mg | ORAL_TABLET | Freq: Every day | ORAL | 1 refills | Status: DC
  Filled 2023-01-15: qty 30, 30d supply, fill #0
  Filled 2023-02-11: qty 30, 30d supply, fill #1

## 2023-01-15 MED ORDER — OLMESARTAN MEDOXOMIL 40 MG PO TABS
40.0000 mg | ORAL_TABLET | Freq: Every day | ORAL | 1 refills | Status: DC
  Filled 2023-01-15: qty 30, 30d supply, fill #0
  Filled 2023-02-11: qty 30, 30d supply, fill #1

## 2023-01-15 NOTE — Telephone Encounter (Signed)
Called patient as your last noted in June said for patient to send you a message in July with BP readings. He said his BP is the same, has not come down still around 140/100 each time he takes it.

## 2023-01-31 ENCOUNTER — Other Ambulatory Visit: Payer: Self-pay | Admitting: Family Medicine

## 2023-02-02 ENCOUNTER — Other Ambulatory Visit (HOSPITAL_COMMUNITY): Payer: Self-pay

## 2023-02-02 MED ORDER — AMLODIPINE BESYLATE 5 MG PO TABS
5.0000 mg | ORAL_TABLET | Freq: Every day | ORAL | 1 refills | Status: DC
  Filled 2023-02-02: qty 30, 30d supply, fill #0
  Filled 2023-02-11 – 2023-03-02 (×2): qty 30, 30d supply, fill #1

## 2023-02-11 ENCOUNTER — Other Ambulatory Visit (HOSPITAL_COMMUNITY): Payer: Self-pay

## 2023-02-24 ENCOUNTER — Other Ambulatory Visit (HOSPITAL_COMMUNITY): Payer: Self-pay

## 2023-02-25 ENCOUNTER — Other Ambulatory Visit (HOSPITAL_COMMUNITY): Payer: Self-pay

## 2023-03-03 ENCOUNTER — Other Ambulatory Visit (HOSPITAL_COMMUNITY): Payer: Self-pay

## 2023-03-13 ENCOUNTER — Other Ambulatory Visit: Payer: Self-pay | Admitting: Family Medicine

## 2023-03-13 ENCOUNTER — Other Ambulatory Visit (HOSPITAL_COMMUNITY): Payer: Self-pay

## 2023-03-13 ENCOUNTER — Other Ambulatory Visit: Payer: Self-pay

## 2023-03-13 MED ORDER — HYDROCHLOROTHIAZIDE 12.5 MG PO TABS
12.5000 mg | ORAL_TABLET | Freq: Every day | ORAL | 1 refills | Status: DC
  Filled 2023-03-13: qty 30, 30d supply, fill #0
  Filled 2023-04-14 (×2): qty 30, 30d supply, fill #1

## 2023-03-13 MED ORDER — OLMESARTAN MEDOXOMIL 40 MG PO TABS
40.0000 mg | ORAL_TABLET | Freq: Every day | ORAL | 1 refills | Status: DC
  Filled 2023-03-13: qty 30, 30d supply, fill #0
  Filled 2023-04-14 (×2): qty 30, 30d supply, fill #1

## 2023-04-01 ENCOUNTER — Encounter: Payer: Self-pay | Admitting: Psychiatry

## 2023-04-03 ENCOUNTER — Other Ambulatory Visit: Payer: Self-pay | Admitting: Family Medicine

## 2023-04-03 ENCOUNTER — Other Ambulatory Visit (HOSPITAL_COMMUNITY): Payer: Self-pay

## 2023-04-03 MED ORDER — AMLODIPINE BESYLATE 5 MG PO TABS
5.0000 mg | ORAL_TABLET | Freq: Every day | ORAL | 1 refills | Status: DC
  Filled 2023-04-03: qty 90, 90d supply, fill #0
  Filled 2023-07-02: qty 90, 90d supply, fill #1

## 2023-04-14 ENCOUNTER — Other Ambulatory Visit (HOSPITAL_COMMUNITY): Payer: Self-pay

## 2023-04-14 ENCOUNTER — Other Ambulatory Visit: Payer: Self-pay

## 2023-04-17 ENCOUNTER — Other Ambulatory Visit (HOSPITAL_COMMUNITY): Payer: Self-pay

## 2023-05-11 ENCOUNTER — Other Ambulatory Visit: Payer: Self-pay

## 2023-05-14 ENCOUNTER — Other Ambulatory Visit: Payer: Self-pay

## 2023-05-14 ENCOUNTER — Other Ambulatory Visit: Payer: Self-pay | Admitting: Family Medicine

## 2023-05-14 ENCOUNTER — Other Ambulatory Visit (HOSPITAL_COMMUNITY): Payer: Self-pay

## 2023-05-14 MED ORDER — OLMESARTAN MEDOXOMIL 40 MG PO TABS
40.0000 mg | ORAL_TABLET | Freq: Every day | ORAL | 2 refills | Status: DC
  Filled 2023-05-14: qty 30, 30d supply, fill #0
  Filled 2023-06-11: qty 30, 30d supply, fill #1
  Filled 2023-07-15: qty 30, 30d supply, fill #2

## 2023-05-14 MED ORDER — HYDROCHLOROTHIAZIDE 12.5 MG PO TABS
12.5000 mg | ORAL_TABLET | Freq: Every day | ORAL | 2 refills | Status: DC
  Filled 2023-05-14: qty 30, 30d supply, fill #0
  Filled 2023-06-11: qty 30, 30d supply, fill #1
  Filled 2023-07-15: qty 30, 30d supply, fill #2

## 2023-05-29 ENCOUNTER — Other Ambulatory Visit (HOSPITAL_COMMUNITY): Payer: Self-pay

## 2023-06-01 ENCOUNTER — Other Ambulatory Visit (HOSPITAL_BASED_OUTPATIENT_CLINIC_OR_DEPARTMENT_OTHER): Payer: Self-pay

## 2023-06-01 ENCOUNTER — Other Ambulatory Visit (HOSPITAL_COMMUNITY): Payer: Self-pay

## 2023-06-02 ENCOUNTER — Other Ambulatory Visit (HOSPITAL_COMMUNITY): Payer: Self-pay

## 2023-06-11 ENCOUNTER — Other Ambulatory Visit (HOSPITAL_COMMUNITY): Payer: Self-pay

## 2023-07-02 ENCOUNTER — Other Ambulatory Visit (HOSPITAL_COMMUNITY): Payer: Self-pay

## 2023-07-14 ENCOUNTER — Encounter: Payer: Self-pay | Admitting: Internal Medicine

## 2023-07-15 ENCOUNTER — Other Ambulatory Visit (HOSPITAL_COMMUNITY): Payer: Self-pay

## 2023-07-28 ENCOUNTER — Other Ambulatory Visit (HOSPITAL_COMMUNITY): Payer: Self-pay

## 2023-08-06 ENCOUNTER — Other Ambulatory Visit (HOSPITAL_COMMUNITY): Payer: Self-pay

## 2023-08-11 ENCOUNTER — Other Ambulatory Visit (HOSPITAL_COMMUNITY): Payer: Self-pay

## 2023-08-11 ENCOUNTER — Other Ambulatory Visit: Payer: Self-pay | Admitting: Family Medicine

## 2023-08-11 MED ORDER — OLMESARTAN MEDOXOMIL 40 MG PO TABS
40.0000 mg | ORAL_TABLET | Freq: Every day | ORAL | 2 refills | Status: DC
  Filled 2023-08-11: qty 30, 30d supply, fill #0
  Filled 2023-09-14: qty 30, 30d supply, fill #1
  Filled 2023-10-11: qty 30, 30d supply, fill #2

## 2023-08-11 MED ORDER — HYDROCHLOROTHIAZIDE 12.5 MG PO TABS
12.5000 mg | ORAL_TABLET | Freq: Every day | ORAL | 2 refills | Status: DC
  Filled 2023-08-11: qty 30, 30d supply, fill #0
  Filled 2023-09-14: qty 30, 30d supply, fill #1
  Filled 2023-10-11: qty 30, 30d supply, fill #2

## 2023-09-02 ENCOUNTER — Other Ambulatory Visit (HOSPITAL_COMMUNITY): Payer: Self-pay

## 2023-09-14 ENCOUNTER — Other Ambulatory Visit (HOSPITAL_COMMUNITY): Payer: Self-pay

## 2023-10-02 ENCOUNTER — Other Ambulatory Visit (HOSPITAL_COMMUNITY): Payer: Self-pay

## 2023-10-02 ENCOUNTER — Other Ambulatory Visit: Payer: Self-pay | Admitting: Family Medicine

## 2023-10-02 MED ORDER — AMLODIPINE BESYLATE 5 MG PO TABS
5.0000 mg | ORAL_TABLET | Freq: Every day | ORAL | 1 refills | Status: DC
  Filled 2023-10-02: qty 90, 90d supply, fill #0
  Filled 2023-12-30: qty 90, 90d supply, fill #1

## 2023-10-22 ENCOUNTER — Other Ambulatory Visit (HOSPITAL_COMMUNITY): Payer: Self-pay

## 2023-11-05 ENCOUNTER — Other Ambulatory Visit (HOSPITAL_COMMUNITY): Payer: Self-pay

## 2023-11-06 ENCOUNTER — Ambulatory Visit: Payer: PPO | Admitting: Psychiatry

## 2023-11-06 ENCOUNTER — Encounter: Payer: Self-pay | Admitting: Behavioral Health

## 2023-11-06 ENCOUNTER — Other Ambulatory Visit (HOSPITAL_COMMUNITY): Payer: Self-pay

## 2023-11-06 ENCOUNTER — Ambulatory Visit: Payer: PPO | Admitting: Behavioral Health

## 2023-11-06 DIAGNOSIS — F411 Generalized anxiety disorder: Secondary | ICD-10-CM | POA: Diagnosis not present

## 2023-11-06 DIAGNOSIS — F5105 Insomnia due to other mental disorder: Secondary | ICD-10-CM

## 2023-11-06 DIAGNOSIS — F410 Panic disorder [episodic paroxysmal anxiety] without agoraphobia: Secondary | ICD-10-CM

## 2023-11-06 DIAGNOSIS — F99 Mental disorder, not otherwise specified: Secondary | ICD-10-CM | POA: Diagnosis not present

## 2023-11-06 DIAGNOSIS — F341 Dysthymic disorder: Secondary | ICD-10-CM | POA: Diagnosis not present

## 2023-11-06 MED ORDER — ESCITALOPRAM OXALATE 20 MG PO TABS
20.0000 mg | ORAL_TABLET | Freq: Every day | ORAL | 3 refills | Status: AC
  Filled 2023-11-06 – 2023-11-30 (×2): qty 90, 90d supply, fill #0
  Filled 2024-02-29: qty 90, 90d supply, fill #1
  Filled 2024-05-30: qty 90, 90d supply, fill #2

## 2023-11-06 MED ORDER — HYDROXYZINE HCL 10 MG PO TABS
10.0000 mg | ORAL_TABLET | ORAL | 3 refills | Status: AC
  Filled 2023-11-06 – 2024-01-14 (×2): qty 420, 35d supply, fill #0

## 2023-11-06 MED ORDER — MIRTAZAPINE 15 MG PO TABS
15.0000 mg | ORAL_TABLET | Freq: Every evening | ORAL | 3 refills | Status: AC | PRN
  Filled 2024-02-08: qty 90, 90d supply, fill #0
  Filled 2024-05-14: qty 90, 90d supply, fill #1

## 2023-11-06 MED ORDER — ARIPIPRAZOLE 5 MG PO TABS
5.0000 mg | ORAL_TABLET | Freq: Every day | ORAL | 3 refills | Status: AC
  Filled 2023-11-06 – 2023-11-30 (×2): qty 90, 90d supply, fill #0
  Filled 2024-02-29: qty 90, 90d supply, fill #1
  Filled 2024-05-30: qty 90, 90d supply, fill #2

## 2023-11-06 NOTE — Progress Notes (Signed)
 [facility]  Patient ID: Nathaniel Pena,  MRN: [id number]  PCP: Watson Hacking, MD  Date of Evaluation: 11/06/2023 Time spent:30 minutes  Chief Complaint:  Chief Complaint   Depression; Anxiety; Follow-up; Patient Education; Medication Refill     HISTORY/CURRENT STATUS: HPI Nathaniel Pena presents to the office today for follow-up of depression, anxiety, and insomnia. Previous patient of Roselyn Connor. Says he has maintained wonderful stability over the last year. Says medications turned things around for him. He wishes to remain on current medication regimen without changes. Reports his anxiety today at 2/10 and depression at 2/10. Sleeping well. Good appetite. No SI.    Individual Medical History/ Review of Systems: Changes? :No   Allergies: Patient has no known allergies.  Current Medications:  Current Outpatient Medications:    amLODipine  (NORVASC ) 5 MG tablet, Take 1 tablet (5 mg total) by mouth daily. Take along with olmesartan  & HCTZ (hydrochlorothiazide ), Disp: 90 tablet, Rfl: 1   ARIPiprazole  (ABILIFY ) 5 MG tablet, Take 1 tablet (5 mg total) by mouth daily., Disp: 90 tablet, Rfl: 3   aspirin  81 MG chewable tablet, Chew 1 tablet (81 mg total) by mouth every 6 (six) hours as needed for mild pain. (Patient taking differently: Chew 81 mg by mouth daily.), Disp: 90 tablet, Rfl: 3   atorvastatin  (LIPITOR) 20 MG tablet, Take 1 tablet (20 mg total) by mouth daily. (Patient not taking: Reported on 11/07/2022), Disp: 90 tablet, Rfl: 3   escitalopram  (LEXAPRO ) 20 MG tablet, Take 1 tablet (20 mg total) by mouth daily., Disp: 90 tablet, Rfl: 3   furosemide  (LASIX ) 20 MG tablet, Take 1 tablet (20 mg total) by mouth daily. (Patient not taking: Reported on 11/07/2022), Disp: 30 tablet, Rfl: 0   hydrochlorothiazide  (HYDRODIURIL ) 12.5 MG tablet, Take 1 tablet (12.5 mg total) by mouth daily (take with olmesartan , amlodipine ), Disp: 30 tablet, Rfl: 2   hydrOXYzine  (ATARAX ) 10  MG tablet, Take 1-2 tablets (10-20 mg total) by mouth every 4 (four) hours as needed for anxiety, Disp: 420 tablet, Rfl: 3   mirtazapine  (REMERON ) 15 MG tablet, Take 1 tablet (15 mg total) by mouth at bedtime as needed., Disp: 90 tablet, Rfl: 3   olmesartan  (BENICAR ) 40 MG tablet, Take 1 tablet (40 mg total) by mouth daily (take with amlodipine ,hctz), Disp: 30 tablet, Rfl: 2   Olmesartan -amLODIPine -HCTZ (TRIBENZOR) 40-5-12.5 MG TABS, Take 1 tablet by mouth daily., Disp: 30 tablet, Rfl: 1 Medication Side Effects: none  Family Medical/ Social History: Changes? No  MENTAL HEALTH EXAM:  There were no vitals taken for this visit.There is no height or weight on file to calculate BMI.  General Appearance: Casual, Neat, and Well Groomed  Eye Contact:  Good  Speech:  Clear and Coherent  Volume:  Normal  Mood:  NA  Affect:  Appropriate  Thought Process:  Coherent  Orientation:  Full (Time, Place, and Person)  Thought Content: Logical   Suicidal Thoughts:  No  Homicidal Thoughts:  No  Memory:  WNL  Judgement:  Good  Insight:  Good  Psychomotor Activity:  Normal  Concentration:  Concentration: Good  Recall:  Good  Fund of Knowledge: Good  Language: Good  Assets:  Desire for Improvement  ADL's:  Intact  Cognition: WNL  Prognosis:  Good    DIAGNOSES:    ICD-10-CM   1. Generalized anxiety disorder  F41.1 hydrOXYzine  (ATARAX ) 10 MG tablet    escitalopram  (LEXAPRO ) 20 MG tablet    2. 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 Insomnia due to other mental disorder  F51.05 mirtazapine  (REMERON ) 15 MG tablet   F99       Receiving Psychotherapy: No    RECOMMENDATIONS:   Greater than 50% of  30 min face to face time with patient was spent on counseling and coordination of care. We  discussed his continued good stability. He has no questions or concerns this visit. Says that he has reduced his ETOH intake and follows up with PCP regularly.  Will continue Lexapro  20 mg daily for anxiety and depression. Continue Abilify  5 mg daily for depression. Continue hydroxyzine  as needed for anxiety. Recommended follow-up in 6 months.  Patient requested to extend follow-up to 1 year and reports that he will contact us  sooner if needed.  Pt to follow-up in one year or sooner if clinically indicated. Provided emergency contact information  Reviewed PDMP    Lincoln Renshaw, NP

## 2023-11-12 ENCOUNTER — Other Ambulatory Visit: Payer: Self-pay | Admitting: Family Medicine

## 2023-11-12 ENCOUNTER — Other Ambulatory Visit (HOSPITAL_COMMUNITY): Payer: Self-pay

## 2023-11-12 MED ORDER — OLMESARTAN MEDOXOMIL 40 MG PO TABS
40.0000 mg | ORAL_TABLET | Freq: Every day | ORAL | 2 refills | Status: DC
Start: 2023-11-12 — End: 2024-02-08
  Filled 2023-11-12: qty 30, 30d supply, fill #0
  Filled 2023-12-09: qty 30, 30d supply, fill #1
  Filled 2024-01-08: qty 30, 30d supply, fill #2

## 2023-11-12 MED ORDER — HYDROCHLOROTHIAZIDE 12.5 MG PO TABS
12.5000 mg | ORAL_TABLET | Freq: Every day | ORAL | 2 refills | Status: DC
  Filled 2023-11-12: qty 30, 30d supply, fill #0
  Filled 2023-12-09: qty 30, 30d supply, fill #1
  Filled 2024-01-11: qty 30, 30d supply, fill #2

## 2023-11-30 ENCOUNTER — Other Ambulatory Visit (HOSPITAL_COMMUNITY): Payer: Self-pay

## 2023-12-09 ENCOUNTER — Other Ambulatory Visit (HOSPITAL_COMMUNITY): Payer: Self-pay

## 2023-12-29 ENCOUNTER — Ambulatory Visit

## 2023-12-30 ENCOUNTER — Other Ambulatory Visit (HOSPITAL_COMMUNITY): Payer: Self-pay

## 2023-12-31 ENCOUNTER — Other Ambulatory Visit (HOSPITAL_COMMUNITY): Payer: Self-pay

## 2024-01-08 ENCOUNTER — Other Ambulatory Visit (HOSPITAL_COMMUNITY): Payer: Self-pay

## 2024-01-11 ENCOUNTER — Other Ambulatory Visit (HOSPITAL_COMMUNITY): Payer: Self-pay

## 2024-01-14 ENCOUNTER — Other Ambulatory Visit: Payer: Self-pay

## 2024-01-15 ENCOUNTER — Other Ambulatory Visit (HOSPITAL_COMMUNITY): Payer: Self-pay

## 2024-02-08 ENCOUNTER — Other Ambulatory Visit (HOSPITAL_COMMUNITY): Payer: Self-pay

## 2024-02-08 ENCOUNTER — Other Ambulatory Visit: Payer: Self-pay | Admitting: Family Medicine

## 2024-02-08 MED ORDER — OLMESARTAN MEDOXOMIL 40 MG PO TABS
40.0000 mg | ORAL_TABLET | Freq: Every day | ORAL | 1 refills | Status: DC
  Filled 2024-02-08: qty 30, 30d supply, fill #0
  Filled 2024-03-09: qty 30, 30d supply, fill #1

## 2024-02-08 MED ORDER — HYDROCHLOROTHIAZIDE 12.5 MG PO TABS
12.5000 mg | ORAL_TABLET | Freq: Every day | ORAL | 1 refills | Status: DC
  Filled 2024-02-08: qty 30, 30d supply, fill #0
  Filled 2024-03-09: qty 30, 30d supply, fill #1

## 2024-02-09 ENCOUNTER — Other Ambulatory Visit (HOSPITAL_COMMUNITY): Payer: Self-pay

## 2024-02-29 ENCOUNTER — Other Ambulatory Visit (HOSPITAL_COMMUNITY): Payer: Self-pay

## 2024-03-09 ENCOUNTER — Other Ambulatory Visit: Payer: Self-pay

## 2024-03-09 ENCOUNTER — Other Ambulatory Visit (HOSPITAL_COMMUNITY): Payer: Self-pay

## 2024-03-18 ENCOUNTER — Encounter: Payer: Self-pay | Admitting: Family Medicine

## 2024-03-18 ENCOUNTER — Other Ambulatory Visit (HOSPITAL_COMMUNITY): Payer: Self-pay

## 2024-03-18 ENCOUNTER — Ambulatory Visit (INDEPENDENT_AMBULATORY_CARE_PROVIDER_SITE_OTHER): Admitting: Family Medicine

## 2024-03-18 VITALS — BP 130/84 | HR 71 | Ht 76.0 in | Wt 222.8 lb

## 2024-03-18 DIAGNOSIS — R351 Nocturia: Secondary | ICD-10-CM

## 2024-03-18 DIAGNOSIS — Z23 Encounter for immunization: Secondary | ICD-10-CM | POA: Diagnosis not present

## 2024-03-18 DIAGNOSIS — Z0000 Encounter for general adult medical examination without abnormal findings: Secondary | ICD-10-CM

## 2024-03-18 DIAGNOSIS — I10 Essential (primary) hypertension: Secondary | ICD-10-CM

## 2024-03-18 DIAGNOSIS — Z9189 Other specified personal risk factors, not elsewhere classified: Secondary | ICD-10-CM

## 2024-03-18 DIAGNOSIS — Z13220 Encounter for screening for lipoid disorders: Secondary | ICD-10-CM

## 2024-03-18 MED ORDER — ATORVASTATIN CALCIUM 20 MG PO TABS
20.0000 mg | ORAL_TABLET | Freq: Every day | ORAL | 3 refills | Status: AC
Start: 2024-03-18 — End: ?
  Filled 2024-03-18: qty 90, 90d supply, fill #0

## 2024-03-18 MED ORDER — ASPIRIN 81 MG PO CHEW
81.0000 mg | CHEWABLE_TABLET | Freq: Every day | ORAL | 0 refills | Status: AC
Start: 2024-03-18 — End: ?
  Filled 2024-03-18 (×2): qty 90, 90d supply, fill #0

## 2024-03-18 NOTE — Progress Notes (Signed)
 Name: Graves Nipp Berka   Date of Visit: 03/18/24   Date of last visit with me: Visit date not found   CHIEF COMPLAINT:  Chief Complaint  Patient presents with   Annual Exam    Cpe and Awv.        HPI:  Discussed the use of AI scribe software for clinical note transcription with the patient, who gave verbal consent to proceed.  History of Present Illness   JOHNNIE GOYNES is a 68 year old male with hypertension who presents for a routine follow-up visit.  He is not currently on any cholesterol medication. There was a previous discussion about starting a statin.  His cholesterol levels have been increasing over the last few years, despite stable weight within a healthy range. He has a family history of hypertension.  No new symptoms or concerns reported. He feels in good health.     I had lengthy discussion with patient regarding his advance directive.   I discussed with patient his options and how that affect him if he were to ever end up in the hospital.  After the discussion patient would like to go ahead and make some changes to his advance directives.  A new sheet was provided to patient we will go ahead and make these changes. OBJECTIVE:       03/18/2024    8:19 AM  Depression screen PHQ 2/9  Decreased Interest 0  Down, Depressed, Hopeless 0  PHQ - 2 Score 0  Altered sleeping 2  Tired, decreased energy 0  Change in appetite 0  Feeling bad or failure about yourself  0  Trouble concentrating 0  Moving slowly or fidgety/restless 1  Suicidal thoughts 0  PHQ-9 Score 3     BP Readings from Last 3 Encounters:  03/18/24 130/84  11/13/22 (!) 146/97  11/06/22 (!) 130/90    BP 130/84   Pulse 71   Ht 6' 4 (1.93 m)   Wt 222 lb 12.8 oz (101.1 kg)   SpO2 98%   BMI 27.12 kg/m    Physical Exam          Physical Exam Constitutional:      Appearance: Normal appearance.  Neurological:     General: No focal deficit present.     Mental Status: He is alert and  oriented to person, place, and time. Mental status is at baseline.     ASSESSMENT/PLAN:   Assessment & Plan Routine general medical examination at a health care facility  Medicare annual wellness visit, subsequent  Primary hypertension  Screening for lipid disorders  Flu vaccine need  At risk for heart disease  Nocturia    Assessment and Plan    Adult Wellness Visit Routine wellness visit with well-controlled blood pressure. No new concerns or symptoms. Up to date on screenings and vaccinations. - Continue current health maintenance regimen. - Perform routine blood tests. - Flu vaccine  Essential hypertension Blood pressure is well-controlled with current management. - Continue amlodipine , hydrochlorothiazide  and olmesartan  - CMP and CBC   Hyperlipidemia Cholesterol levels increasing. Discussed starting Lipitor to manage cholesterol and reduce cardiovascular risk. Decision based on cholesterol trend and overall health. - Initiate Lipitor at a 20mg   Goals of Care Recommended reconsidering current choices due to his healthy status. Emphasized importance of CPR and full resuscitation given his healthy status, patient is agreeable and would like to change his decisions after having had more information. - Provide new advance directive form for revision. -  Encourage reconsideration of full resuscitation and treatment options.  -Approximately 19 minutes was used during visit to discuss patients goals of care and allowed patient to decide his advanced directives.         Anneke Cundy A. Vita MD [facility]

## 2024-03-19 ENCOUNTER — Ambulatory Visit: Payer: Self-pay | Admitting: Family Medicine

## 2024-03-19 LAB — CBC WITH DIFFERENTIAL/PLATELET
Basophils Absolute: 0 x10E3/uL (ref 0.0–0.2)
Basos: 1 %
EOS (ABSOLUTE): 0.1 x10E3/uL (ref 0.0–0.4)
Eos: 2 %
Hematocrit: 46.4 % (ref 37.5–51.0)
Hemoglobin: 15.5 g/dL (ref 13.0–17.7)
Immature Grans (Abs): 0 x10E3/uL (ref 0.0–0.1)
Immature Granulocytes: 0 %
Lymphocytes Absolute: 1.7 x10E3/uL (ref 0.7–3.1)
Lymphs: 29 %
MCH: 33.1 pg — ABNORMAL HIGH (ref 26.6–33.0)
MCHC: 33.4 g/dL (ref 31.5–35.7)
MCV: 99 fL — ABNORMAL HIGH (ref 79–97)
Monocytes Absolute: 0.6 x10E3/uL (ref 0.1–0.9)
Monocytes: 10 %
Neutrophils Absolute: 3.4 x10E3/uL (ref 1.4–7.0)
Neutrophils: 57 %
Platelets: 238 x10E3/uL (ref 150–450)
RBC: 4.68 x10E6/uL (ref 4.14–5.80)
RDW: 11.8 % (ref 11.6–15.4)
WBC: 5.8 x10E3/uL (ref 3.4–10.8)

## 2024-03-19 LAB — LIPID PANEL
Chol/HDL Ratio: 1.9 ratio (ref 0.0–5.0)
Cholesterol, Total: 169 mg/dL (ref 100–199)
HDL: 87 mg/dL (ref >39)
LDL Chol Calc (NIH): 72 mg/dL (ref 0–99)
Triglycerides: 46 mg/dL (ref 0–149)
VLDL Cholesterol Cal: 10 mg/dL (ref 5–40)

## 2024-03-19 LAB — COMPREHENSIVE METABOLIC PANEL WITH GFR
ALT: 20 IU/L (ref 0–44)
AST: 27 IU/L (ref 0–40)
Albumin: 4.4 g/dL (ref 3.9–4.9)
Alkaline Phosphatase: 94 IU/L (ref 47–123)
BUN/Creatinine Ratio: 18 (ref 10–24)
BUN: 13 mg/dL (ref 8–27)
Bilirubin Total: 0.5 mg/dL (ref 0.0–1.2)
CO2: 26 mmol/L (ref 20–29)
Calcium: 9.6 mg/dL (ref 8.6–10.2)
Chloride: 97 mmol/L (ref 96–106)
Creatinine, Ser: 0.73 mg/dL — ABNORMAL LOW (ref 0.76–1.27)
Globulin, Total: 2 g/dL (ref 1.5–4.5)
Glucose: 103 mg/dL — ABNORMAL HIGH (ref 70–99)
Potassium: 4.4 mmol/L (ref 3.5–5.2)
Sodium: 136 mmol/L (ref 134–144)
Total Protein: 6.4 g/dL (ref 6.0–8.5)
eGFR: 98 mL/min/1.73 (ref >59)

## 2024-03-19 LAB — PSA: Prostate Specific Ag, Serum: 1.2 ng/mL (ref 0.0–4.0)

## 2024-03-30 ENCOUNTER — Other Ambulatory Visit: Payer: Self-pay

## 2024-03-30 ENCOUNTER — Other Ambulatory Visit: Payer: Self-pay | Admitting: Family Medicine

## 2024-03-30 ENCOUNTER — Other Ambulatory Visit (HOSPITAL_COMMUNITY): Payer: Self-pay

## 2024-03-30 MED ORDER — AMLODIPINE BESYLATE 5 MG PO TABS
5.0000 mg | ORAL_TABLET | Freq: Every day | ORAL | 1 refills | Status: AC
  Filled 2024-03-30: qty 90, 90d supply, fill #0

## 2024-03-31 ENCOUNTER — Other Ambulatory Visit (HOSPITAL_COMMUNITY): Payer: Self-pay

## 2024-04-08 ENCOUNTER — Other Ambulatory Visit (HOSPITAL_COMMUNITY): Payer: Self-pay

## 2024-04-08 ENCOUNTER — Other Ambulatory Visit: Payer: Self-pay | Admitting: Family Medicine

## 2024-04-08 MED ORDER — OLMESARTAN MEDOXOMIL 40 MG PO TABS
40.0000 mg | ORAL_TABLET | Freq: Every day | ORAL | 1 refills | Status: DC
  Filled 2024-04-08: qty 30, 30d supply, fill #0
  Filled 2024-05-09: qty 30, 30d supply, fill #1

## 2024-04-11 ENCOUNTER — Other Ambulatory Visit (HOSPITAL_COMMUNITY): Payer: Self-pay

## 2024-04-11 ENCOUNTER — Other Ambulatory Visit: Payer: Self-pay | Admitting: Family Medicine

## 2024-04-11 MED ORDER — HYDROCHLOROTHIAZIDE 12.5 MG PO TABS
12.5000 mg | ORAL_TABLET | Freq: Every day | ORAL | 1 refills | Status: DC
  Filled 2024-04-11: qty 30, 30d supply, fill #0
  Filled 2024-05-09: qty 30, 30d supply, fill #1

## 2024-05-09 ENCOUNTER — Other Ambulatory Visit: Payer: Self-pay

## 2024-05-14 ENCOUNTER — Other Ambulatory Visit (HOSPITAL_COMMUNITY): Payer: Self-pay

## 2024-05-30 ENCOUNTER — Other Ambulatory Visit (HOSPITAL_COMMUNITY): Payer: Self-pay

## 2024-06-08 ENCOUNTER — Other Ambulatory Visit: Payer: Self-pay

## 2024-06-08 ENCOUNTER — Other Ambulatory Visit: Payer: Self-pay | Admitting: Family Medicine

## 2024-06-08 ENCOUNTER — Other Ambulatory Visit (HOSPITAL_COMMUNITY): Payer: Self-pay

## 2024-06-08 MED ORDER — OLMESARTAN MEDOXOMIL 40 MG PO TABS
40.0000 mg | ORAL_TABLET | Freq: Every day | ORAL | 1 refills | Status: AC
  Filled 2024-06-08: qty 30, 30d supply, fill #0

## 2024-06-08 MED ORDER — HYDROCHLOROTHIAZIDE 12.5 MG PO TABS
12.5000 mg | ORAL_TABLET | Freq: Every day | ORAL | 1 refills | Status: AC
  Filled 2024-06-08: qty 30, 30d supply, fill #0

## 2024-09-15 ENCOUNTER — Ambulatory Visit: Admitting: Family Medicine

## 2024-11-04 ENCOUNTER — Ambulatory Visit: Admitting: Behavioral Health
